# Patient Record
Sex: Male | Born: 1965 | Race: Black or African American | Hispanic: No | Marital: Single | Smoking: Light tobacco smoker
Health system: Southern US, Community
[De-identification: ages and names within clinical notes are randomized; demographics above are authoritative.]

## PROBLEM LIST (undated history)

## (undated) HISTORY — PX: TOE SURGERY: SHX1073

---

## 2001-01-30 ENCOUNTER — Emergency Department (HOSPITAL_COMMUNITY): Admission: EM | Admit: 2001-01-30 | Discharge: 2001-01-30 | Payer: Self-pay | Admitting: *Deleted

## 2004-05-07 ENCOUNTER — Emergency Department (HOSPITAL_COMMUNITY): Admission: EM | Admit: 2004-05-07 | Discharge: 2004-05-07 | Payer: Self-pay | Admitting: Emergency Medicine

## 2004-07-25 ENCOUNTER — Emergency Department (HOSPITAL_COMMUNITY): Admission: EM | Admit: 2004-07-25 | Discharge: 2004-07-25 | Payer: Self-pay | Admitting: Emergency Medicine

## 2004-09-01 ENCOUNTER — Emergency Department (HOSPITAL_COMMUNITY): Admission: EM | Admit: 2004-09-01 | Discharge: 2004-09-01 | Payer: Self-pay | Admitting: Emergency Medicine

## 2004-09-01 ENCOUNTER — Emergency Department (HOSPITAL_COMMUNITY): Admission: EM | Admit: 2004-09-01 | Discharge: 2004-09-01 | Payer: Self-pay | Admitting: Family Medicine

## 2005-07-30 ENCOUNTER — Emergency Department (HOSPITAL_COMMUNITY): Admission: EM | Admit: 2005-07-30 | Discharge: 2005-07-30 | Payer: Self-pay | Admitting: Family Medicine

## 2006-11-23 ENCOUNTER — Emergency Department (HOSPITAL_COMMUNITY): Admission: EM | Admit: 2006-11-23 | Discharge: 2006-11-23 | Payer: Self-pay | Admitting: Emergency Medicine

## 2006-12-24 ENCOUNTER — Emergency Department (HOSPITAL_COMMUNITY): Admission: EM | Admit: 2006-12-24 | Discharge: 2006-12-24 | Payer: Self-pay | Admitting: Emergency Medicine

## 2006-12-25 ENCOUNTER — Emergency Department (HOSPITAL_COMMUNITY): Admission: EM | Admit: 2006-12-25 | Discharge: 2006-12-25 | Payer: Self-pay | Admitting: Emergency Medicine

## 2007-06-16 ENCOUNTER — Emergency Department (HOSPITAL_COMMUNITY): Admission: EM | Admit: 2007-06-16 | Discharge: 2007-06-17 | Payer: Self-pay | Admitting: Emergency Medicine

## 2007-07-22 ENCOUNTER — Emergency Department (HOSPITAL_COMMUNITY): Admission: EM | Admit: 2007-07-22 | Discharge: 2007-07-22 | Payer: Self-pay | Admitting: Emergency Medicine

## 2008-04-02 ENCOUNTER — Emergency Department (HOSPITAL_COMMUNITY): Admission: EM | Admit: 2008-04-02 | Discharge: 2008-04-02 | Payer: Self-pay | Admitting: Emergency Medicine

## 2008-06-22 ENCOUNTER — Emergency Department (HOSPITAL_COMMUNITY): Admission: EM | Admit: 2008-06-22 | Discharge: 2008-06-22 | Payer: Self-pay | Admitting: Emergency Medicine

## 2009-05-24 ENCOUNTER — Emergency Department (HOSPITAL_COMMUNITY): Admission: EM | Admit: 2009-05-24 | Discharge: 2009-05-24 | Payer: Self-pay | Admitting: Family Medicine

## 2011-03-04 ENCOUNTER — Emergency Department (HOSPITAL_COMMUNITY): Payer: Self-pay

## 2011-03-04 ENCOUNTER — Emergency Department (HOSPITAL_COMMUNITY)
Admission: EM | Admit: 2011-03-04 | Discharge: 2011-03-04 | Disposition: A | Discharge status: Home / Self Care | Payer: Self-pay | Attending: Emergency Medicine | Admitting: Emergency Medicine

## 2011-03-04 DIAGNOSIS — J329 Chronic sinusitis, unspecified: Secondary | ICD-10-CM | POA: Insufficient documentation

## 2011-03-04 DIAGNOSIS — IMO0001 Reserved for inherently not codable concepts without codable children: Secondary | ICD-10-CM | POA: Insufficient documentation

## 2011-03-04 DIAGNOSIS — J3489 Other specified disorders of nose and nasal sinuses: Secondary | ICD-10-CM | POA: Insufficient documentation

## 2011-03-04 DIAGNOSIS — R059 Cough, unspecified: Secondary | ICD-10-CM | POA: Insufficient documentation

## 2011-03-04 DIAGNOSIS — R05 Cough: Secondary | ICD-10-CM | POA: Insufficient documentation

## 2011-03-04 DIAGNOSIS — R6883 Chills (without fever): Secondary | ICD-10-CM | POA: Insufficient documentation

## 2011-05-29 ENCOUNTER — Emergency Department (HOSPITAL_COMMUNITY): Payer: Self-pay

## 2011-05-29 ENCOUNTER — Emergency Department (HOSPITAL_COMMUNITY)
Admission: EM | Admit: 2011-05-29 | Discharge: 2011-05-29 | Disposition: A | Discharge status: Home / Self Care | Payer: Self-pay | Attending: Emergency Medicine | Admitting: Emergency Medicine

## 2011-05-29 DIAGNOSIS — M542 Cervicalgia: Secondary | ICD-10-CM | POA: Insufficient documentation

## 2011-05-29 DIAGNOSIS — M25519 Pain in unspecified shoulder: Secondary | ICD-10-CM | POA: Insufficient documentation

## 2011-06-13 LAB — HEPATIC FUNCTION PANEL
ALT: 14
AST: 21
Albumin: 4.1
Alkaline Phosphatase: 60
Bilirubin, Direct: 0.2
Indirect Bilirubin: 0.5
Total Bilirubin: 0.7
Total Protein: 7

## 2011-06-13 LAB — DIFFERENTIAL
Basophils Absolute: 0.1
Basophils Relative: 1
Eosinophils Absolute: 0
Eosinophils Relative: 1
Lymphocytes Relative: 24
Lymphs Abs: 1.7
Monocytes Absolute: 0.8 — ABNORMAL HIGH
Monocytes Relative: 11
Neutro Abs: 4.5
Neutrophils Relative %: 63

## 2011-06-13 LAB — BASIC METABOLIC PANEL
BUN: 12
CO2: 27
Calcium: 9.5
Chloride: 98
Creatinine, Ser: 1.12
GFR calc Af Amer: >60
GFR calc non Af Amer: >60
Glucose, Bld: 88
Potassium: 4
Sodium: 132 — ABNORMAL LOW

## 2011-06-13 LAB — CBC
HCT: 44.8
Hemoglobin: 15.1
MCHC: 33.7
MCV: 86.1
Platelets: 162
RBC: 5.2
RDW: 14.1 — ABNORMAL HIGH
WBC: 7.1

## 2011-06-13 LAB — LIPASE, BLOOD: Lipase: 25

## 2011-07-14 ENCOUNTER — Encounter: Payer: Self-pay | Admitting: *Deleted

## 2011-07-14 ENCOUNTER — Emergency Department (HOSPITAL_COMMUNITY): Payer: Self-pay

## 2011-07-14 ENCOUNTER — Inpatient Hospital Stay (HOSPITAL_COMMUNITY)
Admission: EM | Admit: 2011-07-14 | Discharge: 2011-07-17 | DRG: 563 | Disposition: A | Discharge status: Home / Self Care | Payer: Self-pay | Attending: Orthopaedic Surgery | Admitting: Orthopaedic Surgery

## 2011-07-14 DIAGNOSIS — S82891A Other fracture of right lower leg, initial encounter for closed fracture: Secondary | ICD-10-CM

## 2011-07-14 DIAGNOSIS — S92009A Unspecified fracture of unspecified calcaneus, initial encounter for closed fracture: Secondary | ICD-10-CM | POA: Diagnosis present

## 2011-07-14 DIAGNOSIS — S82892A Other fracture of left lower leg, initial encounter for closed fracture: Secondary | ICD-10-CM

## 2011-07-14 DIAGNOSIS — S92002A Unspecified fracture of left calcaneus, initial encounter for closed fracture: Secondary | ICD-10-CM

## 2011-07-14 DIAGNOSIS — W108XXA Fall (on) (from) other stairs and steps, initial encounter: Secondary | ICD-10-CM | POA: Diagnosis present

## 2011-07-14 DIAGNOSIS — S82871A Displaced pilon fracture of right tibia, initial encounter for closed fracture: Secondary | ICD-10-CM

## 2011-07-14 DIAGNOSIS — S82899A Other fracture of unspecified lower leg, initial encounter for closed fracture: Principal | ICD-10-CM | POA: Diagnosis present

## 2011-07-14 LAB — CBC
HCT: 39.8 % (ref 39.0–52.0)
HCT: 39.1 % (ref 39.0–52.0)
Hemoglobin: 13.3 g/dL (ref 13.0–17.0)
Hemoglobin: 12.8 g/dL — ABNORMAL LOW (ref 13.0–17.0)
MCH: 29.8 pg (ref 26.0–34.0)
MCH: 29.3 pg (ref 26.0–34.0)
MCHC: 33.4 g/dL (ref 30.0–36.0)
MCHC: 32.7 g/dL (ref 30.0–36.0)
MCV: 89.2 fL (ref 78.0–100.0)
MCV: 89.5 fL (ref 78.0–100.0)
Platelets: 179 10*3/uL (ref 150–400)
Platelets: 172 10*3/uL (ref 150–400)
RBC: 4.46 MIL/uL (ref 4.22–5.81)
RBC: 4.37 MIL/uL (ref 4.22–5.81)
RDW: 13.6 % (ref 11.5–15.5)
RDW: 13.3 % (ref 11.5–15.5)
WBC: 10.4 10*3/uL (ref 4.0–10.5)
WBC: 11.6 10*3/uL — ABNORMAL HIGH (ref 4.0–10.5)

## 2011-07-14 LAB — DIFFERENTIAL
Basophils Absolute: 0.1 10*3/uL (ref 0.0–0.1)
Basophils Relative: 1 % (ref 0–1)
Eosinophils Absolute: 0.1 10*3/uL (ref 0.0–0.7)
Eosinophils Relative: 1 % (ref 0–5)
Lymphocytes Relative: 20 % (ref 12–46)
Lymphs Abs: 2.1 10*3/uL (ref 0.7–4.0)
Monocytes Absolute: 0.8 10*3/uL (ref 0.1–1.0)
Monocytes Relative: 7 % (ref 3–12)
Neutro Abs: 7.4 10*3/uL (ref 1.7–7.7)
Neutrophils Relative %: 72 % (ref 43–77)

## 2011-07-14 LAB — BASIC METABOLIC PANEL
BUN: 11 mg/dL (ref 6–23)
CO2: 26 mEq/L (ref 19–32)
Calcium: 9.5 mg/dL (ref 8.4–10.5)
Chloride: 106 mEq/L (ref 96–112)
Creatinine, Ser: 0.88 mg/dL (ref 0.50–1.35)
GFR calc Af Amer: >90 mL/min (ref >90)
GFR calc non Af Amer: >90 mL/min (ref >90)
Glucose, Bld: 110 mg/dL — ABNORMAL HIGH (ref 70–99)
Potassium: 3.9 mEq/L (ref 3.5–5.1)
Sodium: 141 mEq/L (ref 135–145)

## 2011-07-14 LAB — PROTIME-INR
INR: 0.89 (ref 0.00–1.49)
Prothrombin Time: 12.2 seconds (ref 11.6–15.2)

## 2011-07-14 MED ORDER — THERA M PLUS PO TABS
1.0000 | ORAL_TABLET | Freq: Every day | ORAL | Status: DC
  Administered 2011-07-14 – 2011-07-16 (×3): 1 via ORAL
  Filled 2011-07-14 (×4): qty 1

## 2011-07-14 MED ORDER — SENNA 8.6 MG PO TABS
2.0000 | ORAL_TABLET | Freq: Every day | ORAL | Status: DC | PRN
  Filled 2011-07-14: qty 2

## 2011-07-14 MED ORDER — HYDROMORPHONE HCL PF 1 MG/ML IJ SOLN
INTRAMUSCULAR | Status: AC
  Administered 2011-07-14: 1 mg via INTRAMUSCULAR
  Filled 2011-07-14: qty 1

## 2011-07-14 MED ORDER — OXYCODONE HCL 5 MG PO TABS
5.0000 mg | ORAL_TABLET | ORAL | Status: DC | PRN
  Administered 2011-07-16: 5 mg via ORAL
  Filled 2011-07-14 (×2): qty 1

## 2011-07-14 MED ORDER — SODIUM CHLORIDE 0.9 % IV SOLN
250.0000 mL | INTRAVENOUS | Status: DC
  Administered 2011-07-14: 250 mL via INTRAVENOUS
  Administered 2011-07-15: 12:00:00 via INTRAVENOUS
  Administered 2011-07-16: 1000 mL via INTRAVENOUS

## 2011-07-14 MED ORDER — BISACODYL 10 MG RE SUPP: 10.0000 mg | Freq: Every day | RECTAL | Status: DC | PRN

## 2011-07-14 MED ORDER — NALOXONE HCL 0.4 MG/ML IJ SOLN: 0.4000 mg | INTRAMUSCULAR | Status: DC | PRN

## 2011-07-14 MED ORDER — ONDANSETRON 4 MG PO TBDP
ORAL_TABLET | ORAL | Status: AC
  Administered 2011-07-14: 8 mg
  Filled 2011-07-14: qty 2

## 2011-07-14 MED ORDER — HYDROMORPHONE HCL PF 1 MG/ML IJ SOLN
1.0000 mg | Freq: Once | INTRAMUSCULAR | Status: AC
  Administered 2011-07-14: 1 mg via INTRAVENOUS
  Filled 2011-07-14: qty 1

## 2011-07-14 MED ORDER — DIPHENHYDRAMINE HCL 50 MG/ML IJ SOLN: 25.0000 mg | Freq: Four times a day (QID) | INTRAMUSCULAR | Status: DC | PRN

## 2011-07-14 MED ORDER — MORPHINE SULFATE 2 MG/ML IJ SOLN
2.0000 mg | INTRAMUSCULAR | Status: DC | PRN
  Filled 2011-07-14: qty 1

## 2011-07-14 MED ORDER — HYDROMORPHONE HCL PF 1 MG/ML IJ SOLN
1.0000 mg | INTRAMUSCULAR | Status: DC | PRN
  Administered 2011-07-14 – 2011-07-16 (×3): 1 mg via INTRAVENOUS
  Filled 2011-07-14 (×3): qty 1

## 2011-07-14 MED ORDER — HYDROMORPHONE 0.3 MG/ML IV SOLN
INTRAVENOUS | Status: DC
  Administered 2011-07-14: 7.5 mg via INTRAVENOUS
  Administered 2011-07-14: 0.3 mL via INTRAVENOUS
  Administered 2011-07-14: 2.3 mg via INTRAVENOUS
  Administered 2011-07-15: 7.5 mg via INTRAVENOUS
  Administered 2011-07-15: 4.03 mg via INTRAVENOUS
  Administered 2011-07-15: 3.3 mg via INTRAVENOUS
  Administered 2011-07-15: 3.72 mg via INTRAVENOUS
  Administered 2011-07-15: 3 mg via INTRAVENOUS
  Administered 2011-07-15: 7.5 mg via INTRAVENOUS
  Administered 2011-07-15: 2.39 mg via INTRAVENOUS
  Administered 2011-07-16: 0.6 mg via INTRAVENOUS
  Administered 2011-07-16: 1.8 mg via INTRAVENOUS
  Administered 2011-07-16: 2.1 mg via INTRAVENOUS
  Filled 2011-07-14 (×5): qty 25

## 2011-07-14 MED ORDER — WARFARIN VIDEO
Freq: Once | Status: DC
  Filled 2011-07-14: qty 1

## 2011-07-14 MED ORDER — ONDANSETRON HCL 4 MG/2ML IJ SOLN: 4.0000 mg | Freq: Four times a day (QID) | INTRAMUSCULAR | Status: DC | PRN

## 2011-07-14 MED ORDER — ACETAMINOPHEN 325 MG PO TABS: 650.0000 mg | ORAL_TABLET | Freq: Four times a day (QID) | ORAL | Status: DC | PRN

## 2011-07-14 MED ORDER — HYDROMORPHONE HCL PF 1 MG/ML IJ SOLN: 1.0000 mg | Freq: Once | INTRAMUSCULAR | Status: DC

## 2011-07-14 MED ORDER — SODIUM CHLORIDE 0.9 % IV SOLN
Freq: Once | INTRAVENOUS | Status: AC
  Administered 2011-07-14: 07:00:00 via INTRAVENOUS

## 2011-07-14 MED ORDER — SODIUM CHLORIDE 0.9 % IJ SOLN
3.0000 mL | Freq: Two times a day (BID) | INTRAMUSCULAR | Status: DC
  Administered 2011-07-14: 3 mL via INTRAVENOUS

## 2011-07-14 MED ORDER — SODIUM CHLORIDE 0.9 % IJ SOLN: 3.0000 mL | INTRAMUSCULAR | Status: DC | PRN

## 2011-07-14 MED ORDER — ALUM & MAG HYDROXIDE-SIMETH 200-200-20 MG/5ML PO SUSP: 30.0000 mL | Freq: Four times a day (QID) | ORAL | Status: DC | PRN

## 2011-07-14 MED ORDER — METHOCARBAMOL 500 MG PO TABS
500.0000 mg | ORAL_TABLET | Freq: Four times a day (QID) | ORAL | Status: DC | PRN
  Administered 2011-07-14 – 2011-07-17 (×9): 500 mg via ORAL
  Filled 2011-07-14 (×9): qty 1

## 2011-07-14 MED ORDER — SODIUM CHLORIDE 0.9 % IV SOLN: INTRAVENOUS | Status: DC

## 2011-07-14 MED ORDER — DIPHENHYDRAMINE HCL 12.5 MG/5ML PO ELIX
12.5000 mg | ORAL_SOLUTION | Freq: Four times a day (QID) | ORAL | Status: DC | PRN
  Filled 2011-07-14: qty 5

## 2011-07-14 MED ORDER — ENOXAPARIN SODIUM 40 MG/0.4ML ~~LOC~~ SOLN
40.0000 mg | SUBCUTANEOUS | Status: DC
  Administered 2011-07-14 – 2011-07-15 (×2): 40 mg via SUBCUTANEOUS
  Filled 2011-07-14 (×3): qty 0.4

## 2011-07-14 MED ORDER — COUMADIN BOOK
Freq: Once | Status: AC
  Administered 2011-07-14: 18:00:00
  Filled 2011-07-14: qty 1

## 2011-07-14 MED ORDER — WARFARIN SODIUM 10 MG PO TABS
10.0000 mg | ORAL_TABLET | Freq: Once | ORAL | Status: AC
  Administered 2011-07-14: 10 mg via ORAL
  Filled 2011-07-14: qty 1

## 2011-07-14 MED ORDER — HYDROMORPHONE HCL PF 1 MG/ML IJ SOLN
1.0000 mg | Freq: Once | INTRAMUSCULAR | Status: DC
  Administered 2011-07-14: 1 mg via INTRAMUSCULAR

## 2011-07-14 MED ORDER — ACETAMINOPHEN 650 MG RE SUPP: 650.0000 mg | Freq: Four times a day (QID) | RECTAL | Status: DC | PRN

## 2011-07-14 MED ORDER — HYDROMORPHONE HCL PF 1 MG/ML IJ SOLN
INTRAMUSCULAR | Status: AC
  Administered 2011-07-14: 06:00:00 via INTRAMUSCULAR
  Filled 2011-07-14: qty 1

## 2011-07-14 MED ORDER — ONDANSETRON HCL 4 MG/2ML IJ SOLN
4.0000 mg | Freq: Once | INTRAMUSCULAR | Status: AC
  Administered 2011-07-14: 4 mg via INTRAVENOUS
  Filled 2011-07-14: qty 2

## 2011-07-14 NOTE — H&P (Signed)
Kevin Moran is an 45 y.o. male.   Chief Complaint: bilateral foot and ankle pain  HPI: 45 yo male brought to the ER after hurting his ankles and feet when he jumped down 6-7 steps and landed awkwardly.  Reports severe pain in his feet and ankles.  Denies severe numbness.  Denies other injuries.  History reviewed. No pertinent past medical history.  History reviewed. No pertinent past surgical history.  No family history on file. Social History:  reports that he has never smoked. He does not have any smokeless tobacco history on file. He reports that he drinks alcohol. He reports that he does not use illicit drugs.  Allergies: No Known Allergies  Meds: none  Results for orders placed during the hospital encounter of 07/14/11 (from the past 48 hour(s))  CBC     Status: Normal   Collection Time   07/14/11  6:19 AM      Component Value Range Comment   WBC 10.4  4.0 - 10.5 (K/uL)    RBC 4.46  4.22 - 5.81 (MIL/uL)    Hemoglobin 13.3  13.0 - 17.0 (g/dL)    HCT 16.1  09.6 - 04.5 (%)    MCV 89.2  78.0 - 100.0 (fL)    MCH 29.8  26.0 - 34.0 (pg)    MCHC 33.4  30.0 - 36.0 (g/dL)    RDW 40.9  81.1 - 91.4 (%)    Platelets 179  150 - 400 (K/uL)   DIFFERENTIAL     Status: Normal   Collection Time   07/14/11  6:19 AM      Component Value Range Comment   Neutrophils Relative 72  43 - 77 (%)    Neutro Abs 7.4  1.7 - 7.7 (K/uL)    Lymphocytes Relative 20  12 - 46 (%)    Lymphs Abs 2.1  0.7 - 4.0 (K/uL)    Monocytes Relative 7  3 - 12 (%)    Monocytes Absolute 0.8  0.1 - 1.0 (K/uL)    Eosinophils Relative 1  0 - 5 (%)    Eosinophils Absolute 0.1  0.0 - 0.7 (K/uL)    Basophils Relative 1  0 - 1 (%)    Basophils Absolute 0.1  0.0 - 0.1 (K/uL)   BASIC METABOLIC PANEL     Status: Abnormal   Collection Time   07/14/11  6:19 AM      Component Value Range Comment   Sodium 141  135 - 145 (mEq/L)    Potassium 3.9  3.5 - 5.1 (mEq/L)    Chloride 106  96 - 112 (mEq/L)    CO2 26  19 - 32 (mEq/L)      Glucose, Bld 110 (*) 70 - 99 (mg/dL)    BUN 11  6 - 23 (mg/dL)    Creatinine, Ser 7.82  0.50 - 1.35 (mg/dL)    Calcium 9.5  8.4 - 10.5 (mg/dL)    GFR calc non Af Amer >90  >90 (mL/min)    GFR calc Af Amer >90  >90 (mL/min)    Dg Ankle Complete Left  07/14/2011  *RADIOLOGY REPORT*  Clinical Data: Injury to left ankle, with left ankle and foot pain.  LEFT ANKLE COMPLETE - 3+ VIEW  Comparison: None.  Findings: There is a poorly characterized comminuted fracture involving the calcaneus, with extension to the subtalar joint.  No definite additional fractures are seen.  The ankle mortise is intact; the interosseous space is within normal limits.  No talar tilt or subluxation is seen.  Mild lateral soft tissue swelling is noted.  IMPRESSION: Poorly characterized comminuted fracture involving the calcaneus, with extension to the subtalar joint.  Original Report Authenticated By: Tonia Ghent, M.D.   Dg Ankle Complete Right  07/14/2011  *RADIOLOGY REPORT*  Clinical Data: Right ankle injury; right ankle pain.  RIGHT ANKLE - COMPLETE 3+ VIEW  Comparison: None.  Findings: There is a comminuted fracture involving the distal tibia, with fracture lines extending across the medial and posterior malleoli, and also across the midportion of the tibial plafond.  No definite fibular fracture is seen, though follow-up proximal fibular radiograph is suggested for further evaluation.  There is mild step-off of the tibial fractures at the ankle mortise; the talar dome appears grossly intact.  The interosseous space is within normal limits.  No significant talar tilt or subluxation is seen.  No significant soft tissue abnormalities are seen.  IMPRESSION:  1.  Comminuted fracture involving the distal tibia, with fracture lines extending across the medial and posterior malleoli, and also across the midportion of the tibial plafond.  Mild associated step- off at the ankle mortise; the talar dome appears grossly intact. 2.  No  definite fibular fracture is seen; follow-up proximal fibular radiograph is suggested for further evaluation.  Original Report Authenticated By: Tonia Ghent, M.D.   Ct Ankle Right Wo Contrast  07/14/2011  *RADIOLOGY REPORT*  Clinical Data: Comminuted distal tibia fracture.  CT OF THE RIGHT ANKLE WITH CONTRAST  Technique:  Multidetector CT imaging was performed following the standard protocol during bolus administration of intravenous contrast.  Comparison: Radiographs dated 07/14/2011  Findings: There is a severely comminuted fracture of the distal right tibia. The distal tibial articular surface is severely comminuted with a fracture involving most of the articular surface from anterior to posterior.  There is slight displacement of the posterior fragments but the the medial malleolar fragment is only minimally distracted. There is slight impaction of the lateral aspect of the articular surface posteriorly but this is only 2 mm.  There is no disruption of the ankle mortise.  Distal tibia is intact.  Distal syndesmosis appears to be intact.  There is no visible tendon entrapment.  The talus and calcaneus and the other visualized tarsal bones are normal.  IMPRESSION: Severely comminuted fracture of the articular surface of the distal tibia with medial and posterior malleolar fractures as well. Slight impaction of the posterior aspect of the articular surface of the tibia.  Original Report Authenticated By: Gwynn Burly, M.D.   Ct Ankle Left Wo Contrast  07/14/2011  *RADIOLOGY REPORT*  Clinical Data: Calcaneus fracture.  CT OF THE LEFT ANKLE WITHOUT CONTRAST  Technique:  Multidetector CT imaging was performed according to the standard protocol. Multiplanar CT image reconstructions were also generated.  Comparison: Radiographs dated 07/14/2011  Findings: The patient has a severely comminuted fracture of the calcaneus.  The posterior aspect of the posterior facet is severely comminuted. Base of the  sustentaculum tali is fractured but the articular surface of the medial facet is intact.  Anterior facet is comminuted.  There is impaction particularly of the posterior lateral aspect of the calcaneus.  Distal tibia and fibula and talus are normal.  No visible tendon entrapment.  IMPRESSION: Impacted severely comminuted fracture of the calcaneus as described.  Original Report Authenticated By: Gwynn Burly, M.D.    Review of Systems  All other systems reviewed and are negative.      Blood pressure  142/92, pulse 48, temperature 97.6 F (36.4 C), temperature source Oral, resp. rate 28, height 6\' 2"  (1.88 m), weight 73.5 kg (162 lb 0.6 oz), SpO2 100.00%. Physical Exam  Constitutional: He is oriented to person, place, and time. He appears well-developed and well-nourished.  HENT:  Head: Atraumatic.  Eyes: Pupils are equal, round, and reactive to light.  Neck: Normal range of motion. Neck supple.  Cardiovascular: Normal rate.   Respiratory: Effort normal and breath sounds normal.  GI: Soft. Bowel sounds are normal.  Musculoskeletal:       Right ankle: He exhibits decreased range of motion, swelling and ecchymosis. tenderness. Lateral malleolus and medial malleolus tenderness found.       Left foot: He exhibits decreased range of motion, tenderness, bony tenderness and swelling.  Neurological: He is alert and oriented to person, place, and time.  Skin: Skin is warm and dry.   Both feet have good pulses.  Assessment/Plan Bilateral lower extremity injuries with a right pilon fracture and a left calcaneus fracture 1) Admit to ortho Magnus Ivan 2) NWB bil lower extremities 3) bilateral Jones splints 4) elevation, ice, pain management, DVT prophylaxsis 5) PT/OT consults  Marvis Bakken Y 07/14/2011, 1:29 PM

## 2011-07-14 NOTE — ED Notes (Signed)
Patient states he was jumping and thinks he broke his ankles.  He complains of pain in both ankles

## 2011-07-14 NOTE — ED Notes (Signed)
Sandwich with snacks and beverage provided to pt's spouse.  Pt reminded that he is to remain NPO.

## 2011-07-14 NOTE — ED Provider Notes (Signed)
History     CSN: [id number] Arrival date & time: 07/14/2011  3:47 AM   First MD Initiated Contact with Patient 07/14/11 0408      Chief Complaint  Patient presents with  . Ankle Pain    HPI: Patient is a 45 y.o. male presenting with fall. The history is provided by the patient. No language interpreter was used.  Fall The accident occurred 3 to 5 hours ago. The fall occurred while recreating/playing. He fell from a height of 3 to 5 ft. He landed on a hard floor. Pain location: bilateral ankles. The pain is at a severity of 10/10. The pain is severe. There was no entrapment after the fall. There was no alcohol use involved in the accident. The symptoms are aggravated by standing. He has tried nothing for the symptoms.  States was horseplaying w/ grown son and jumped off of an approx 4 to 5 ft landing and came down wrong. Now w/ severe bil ankle pain and swelling.  History reviewed. No pertinent past medical history.  History reviewed. No pertinent past surgical history.  No family history on file.  History  Substance Use Topics  . Smoking status: Never Smoker   . Smokeless tobacco: Not on file  . Alcohol Use: Yes      Review of Systems  Constitutional: Negative.   HENT: Negative.   Eyes: Negative.   Respiratory: Negative.   Cardiovascular: Negative.   Gastrointestinal: Negative.   Genitourinary: Negative.   Musculoskeletal: Negative.   Skin: Negative.   Neurological: Negative.   Hematological: Negative.   Psychiatric/Behavioral: Negative.     Allergies  Review of patient's allergies indicates no known allergies.  Home Medications  No current outpatient prescriptions on file.  BP 133/89  Pulse 92  Resp 32  SpO2 97%  Physical Exam  Constitutional: He is oriented to person, place, and time. He appears well-developed and well-nourished.  HENT:  Head: Normocephalic and atraumatic.  Eyes: Pupils are equal, round, and reactive to light.  Cardiovascular: Normal  rate.   Pulmonary/Chest: Effort normal.  Musculoskeletal:       Right ankle: He exhibits decreased range of motion, swelling and deformity. He exhibits normal pulse. tenderness.       Left ankle: He exhibits decreased range of motion and swelling. He exhibits no deformity and normal pulse. tenderness.  Neurological: He is alert and oriented to person, place, and time.  Skin: Skin is warm and dry.  Psychiatric: He has a normal mood and affect.    ED Course  Procedures  Bil acute ankle fractures. Consulted w/ Dr August Saucer who will be in to see pt. Findings discussed w/ pt and spouse who are agreeable w/.   Labs Reviewed  CBC  DIFFERENTIAL  BASIC METABOLIC PANEL   Dg Ankle Complete Left  07/14/2011  *RADIOLOGY REPORT*  Clinical Data: Injury to left ankle, with left ankle and foot pain.  LEFT ANKLE COMPLETE - 3+ VIEW  Comparison: None.  Findings: There is a poorly characterized comminuted fracture involving the calcaneus, with extension to the subtalar joint.  No definite additional fractures are seen.  The ankle mortise is intact; the interosseous space is within normal limits.  No talar tilt or subluxation is seen.  Mild lateral soft tissue swelling is noted.  IMPRESSION: Poorly characterized comminuted fracture involving the calcaneus, with extension to the subtalar joint.  Original Report Authenticated By: Tonia Ghent, M.D.   Dg Ankle Complete Right  07/14/2011  *RADIOLOGY REPORT*  Clinical Data:  Right ankle injury; right ankle pain.  RIGHT ANKLE - COMPLETE 3+ VIEW  Comparison: None.  Findings: There is a comminuted fracture involving the distal tibia, with fracture lines extending across the medial and posterior malleoli, and also across the midportion of the tibial plafond.  No definite fibular fracture is seen, though follow-up proximal fibular radiograph is suggested for further evaluation.  There is mild step-off of the tibial fractures at the ankle mortise; the talar dome appears grossly  intact.  The interosseous space is within normal limits.  No significant talar tilt or subluxation is seen.  No significant soft tissue abnormalities are seen.  IMPRESSION:  1.  Comminuted fracture involving the distal tibia, with fracture lines extending across the medial and posterior malleoli, and also across the midportion of the tibial plafond.  Mild associated step- off at the ankle mortise; the talar dome appears grossly intact. 2.  No definite fibular fracture is seen; follow-up proximal fibular radiograph is suggested for further evaluation.  Original Report Authenticated By: Tonia Ghent, M.D.     No diagnosis found.    MDM  Bil acute ankle fx's. Spoke w/ Dr August Saucer @ 0600. Will obtain ct scan of both ankles.and prepare for admission/    Leanne Chang, NP 07/14/11 [id number]  Leanne Chang, NP 07/14/11 [phone]

## 2011-07-14 NOTE — ED Notes (Signed)
Report given to Claris Che, RN on 5000.  Pt to go to room 5032.

## 2011-07-14 NOTE — ED Provider Notes (Signed)
45 year old male who had a fall last night while he was running around and jumping off steps. He landed on his bilateral feet felt a significant pain in both of his ankles and feet and collapsed to the ground. This was acute in onset, constant, unable to walk after the accident.  Physical exam: States patient has tenderness and pain and swelling to the bilateral ankles, normal range of motion of the knees hips wrists. No tenderness over spine, well-appearing without head injury. Lungs are clear no respiratory distress. Neurovascularly intact to the toes.  Assessment according to the plain films the patient has bilateral fractures of his ankle and feet, will need CT scan to further elucidate the etiology and location of these fractures and to help surgical planning. Orthopedic surgery has been consulted and Dr. August Saucer will see the patient and admit.  Medical screening examination/treatment/procedure(s) were conducted as a shared visit with non-physician practitioner(s) and myself.  I personally evaluated the patient during the encounter   Vida Roller, MD 07/14/11 0800

## 2011-07-14 NOTE — Progress Notes (Signed)
ANTICOAGULATION CONSULT NOTE - Initial Consult  Pharmacy Consult for coumadin Indication: VTE prophylaxis  No Known Allergies  Patient Measurements: Height: 6\' 2"  (188 cm) Weight: 162 lb 0.6 oz (73.5 kg) IBW/kg (Calculated) : 82.2   Vital Signs: Temp: 97.6 F (36.4 C) (11/11 1100) Temp src: Oral (11/11 1033) BP: 142/92 mmHg (11/11 1100) Pulse Rate: 48  (11/11 1100)  Labs:  [facility] 07/14/11 0619  HGB 13.3  HCT 39.8  PLT 179  APTT --  LABPROT --  INR --  HEPARINUNFRC --  CREATININE 0.88  CKTOTAL --  CKMB --  TROPONINI --   Estimated Creatinine Clearance: 112.5 ml/min (by C-G formula based on Cr of 0.88).  Medical History: History reviewed. No pertinent past medical history.  Medications:  No prescriptions prior to admission    Assessment: 45 yo male s/p nwb fractures in both lower extremities to be started on coumadin. Will be given 10 mg tonight coumadin. Goal of Therapy:  INR 2-3   Plan:  Coumadin 10 mg po x1 tonight Daily pt/inr F/u cbc daily Coumadin book and video Coumadin education On Lovenox vte prophylaxis for the time being  Kevin Moran, Kevin Moran 07/14/2011,2:34 PM

## 2011-07-15 LAB — PROTIME-INR
INR: 0.9 (ref 0.00–1.49)
Prothrombin Time: 12.3 seconds (ref 11.6–15.2)

## 2011-07-15 MED ORDER — WARFARIN SODIUM 10 MG PO TABS
10.0000 mg | ORAL_TABLET | Freq: Once | ORAL | Status: AC
  Administered 2011-07-15: 10 mg via ORAL
  Filled 2011-07-15: qty 1

## 2011-07-15 NOTE — Progress Notes (Signed)
Physical Therapy Evaluation Patient Details Name: Kevin Moran MRN: [id number] DOB: 08/28/1966 Today's Date: 07/15/2011  Problem List:  Patient Active Problem List  Diagnoses  . Left calcaneal fracture  . Closed right pilon fracture    Past Medical History: History reviewed. No pertinent past medical history. Past Surgical History: History reviewed. No pertinent past surgical history.  PT Assessment/Plan/Recommendation PT Assessment Clinical Impression Statement: Patient presents with a medical diagnosis of bilateral ankle fractures along with the impairments/deficits and therapy diagnosis listed below. Patient will benefit from skilled PT services in the acute care setting to maximize functional mobility and safety in order to d/c home with family support.  PT Recommendation/Assessment: Patient will need skilled PT in the acute care venue PT Problem List: Decreased strength;Decreased range of motion;Decreased activity tolerance;Decreased mobility;Decreased knowledge of use of DME;Decreased knowledge of precautions;Pain PT Therapy Diagnosis : Acute pain PT Plan PT Frequency: Min 5X/week PT Treatment/Interventions: DME instruction;Stair training;Functional mobility training;Therapeutic exercise;Patient/family education;Wheelchair mobility training PT Recommendation Follow Up Recommendations: None Equipment Recommended: Wheelchair (measurements);Wheelchair cushion (measurements) PT Goals  Acute Rehab PT Goals PT Goal Formulation: With patient/family Time For Goal Achievement: 7 days Pt will go Supine/Side to Sit: with modified independence PT Goal: Supine/Side to Sit - Progress: Progressing toward goal Pt will Transfer Bed to Chair/Chair to Bed: with supervision PT Transfer Goal: Bed to Chair/Chair to Bed - Progress: Progressing toward goal Pt will Go Up / Down Stairs: 1-2 stairs;with other equipment (comment) (WC. Family member will be able to bring WC up curb) PT Goal: Up/Down  Stairs - Progress: Other (comment) (unassessed today) Pt will Propel Wheelchair: Independently;> 150 feet PT Goal: Propel Wheelchair - Progress: Other (comment) (unassessed today)  PT Evaluation Precautions/Restrictions  Restrictions Weight Bearing Restrictions: Yes RLE Weight Bearing: Non weight bearing LLE Weight Bearing: Non weight bearing Prior Functioning  Home Living Lives With: Spouse Receives Help From: Family Type of Home: House Home Layout: One level Home Access: Stairs to enter Entrance Stairs-Rails: None Entrance Stairs-Number of Steps: 1 (curb) Bathroom Shower/Tub: Engineer, manufacturing systems: Standard Bathroom Accessibility: Yes How Accessible: Accessible via wheelchair Home Adaptive Equipment: Crutches;Shower chair with back Prior Function Level of Independence: Independent with basic ADLs;Independent with gait;Independent with transfers Driving: Yes Vocation: Full time employment Cognition Cognition Arousal/Alertness: Awake/alert Overall Cognitive Status: Appears within functional limits for tasks assessed Sensation/Coordination Sensation Light Touch: Appears Intact Extremity Assessment RLE Assessment RLE Assessment: Exceptions to [facility] RLE AROM (degrees) Overall AROM Right Lower Extremity: Unable to assess;Due to pain;Due to precautions (Hip and knee WFL) RLE Strength RLE Overall Strength: Due to pain;Unable to assess;Due to precautions (Hip and knee WFL) LLE Assessment LLE Assessment: Exceptions to WFL LLE AROM (degrees) Overall AROM Left Lower Extremity: Due to pain;Unable to assess;Due to precautions (Hip and Knee WFL) LLE Strength LLE Overall Strength: Due to pain;Unable to assess;Due to precautions (Hip and Knee WFL) Mobility (including Balance) Bed Mobility Bed Mobility: Yes Supine to Sit: 4: Min assist (assist of bil LEs for pain control) Supine to Sit Details (indicate cue type and reason): VC for sequencing and hand  placement Transfers Anterior-Posterior Transfer: 4: Min assist Anterior-Posterior Transfer Details (indicate cue type and reason): assist for trunk control. VC for hand placement into recliner. Pt with good UE strength for transfer    Exercise    End of Session PT - End of Session Equipment Utilized During Treatment: Gait belt;Right knee immobilizer Activity Tolerance: Treatment limited secondary to medical complications (Comment) Patient left: in chair;with call bell  in reach;with family/visitor present (wife) Nurse Communication: Mobility status for transfers;Weight bearing status General Behavior During Session: [facility] for tasks performed Cognition: [facility] for tasks performed  Milana Kidney 07/15/2011, 9:27 AM

## 2011-07-15 NOTE — Progress Notes (Signed)
Subjective: No acute changes.  Pain to be expected given fractures.  Will need PT for NWB bil LE.   Objective: Vital signs in last 24 hours: Temp:  [97.6 F (36.4 C)-99.2 F (37.3 C)] 99.1 F (37.3 C) (11/12 0630) Pulse Rate:  [48-85] 85  (11/12 0630) Resp:  [5-28] 18  (11/12 0630) BP: (100-142)/(70-92) 132/78 mmHg (11/12 0630) SpO2:  [96 %-100 %] 99 % (11/12 0630) Weight:  [73.5 kg (162 lb 0.6 oz)] 162 lb 0.6 oz (73.5 kg) (11/11 1100)  Intake/Output from previous day: 11/11 0701 - 11/12 0700 In: 1065 [P.O.:240; I.V.:825] Out: -  Intake/Output this shift:     Basename 07/14/11 1504 07/14/11 0619  HGB 12.8* 13.3    Basename 07/14/11 1504 07/14/11 0619  WBC 11.6* 10.4  RBC 4.37 4.46  HCT 39.1 39.8  PLT 172 179    Basename 07/14/11 1504 07/14/11 0619  NA -- 141  K -- 3.9  CL -- 106  CO2 -- 26  BUN -- 11  CREATININE 0.93 0.88  GLUCOSE -- 110*  CALCIUM -- 9.5    Basename 07/15/11 0540 07/14/11 1504  LABPT -- --  INR 0.90 0.89    Neurovascular intact bil LE.  Jones splints intact  Assessment/Plan: 1) will need PT consult for NWB bil le 2) DVT proph. 3) will place in casts likely tomorrow.   Raziel Koenigs Y 07/15/2011, 7:01 AM

## 2011-07-15 NOTE — Progress Notes (Signed)
ANTICOAGULATION CONSULT NOTE - Follow Up Consult  Pharmacy Consult for coumadin Indication: VTE prophylaxis  No Known Allergies  Patient Measurements: Height: 6\' 2"  (188 cm) Weight: 162 lb 0.6 oz (73.5 kg) IBW/kg (Calculated) : 82.2    Vital Signs: Temp: 99.1 F (37.3 C) (11/12 0630) Temp src: Oral (11/12 0630) BP: 132/78 mmHg (11/12 0630) Pulse Rate: 85  (11/12 0630)  Labs:  Basename 07/15/11 0540 07/14/11 1504 07/14/11 0619  HGB -- 12.8* 13.3  HCT -- 39.1 39.8  PLT -- 172 179  APTT -- -- --  LABPROT 12.3 12.2 --  INR 0.90 0.89 --  HEPARINUNFRC -- -- --  CREATININE -- 0.93 0.88  CKTOTAL -- -- --  CKMB -- -- --  TROPONINI -- -- --   Estimated Creatinine Clearance: 106.5 ml/min (by C-G formula based on Cr of 0.93).   Medications:  On enoxaparin 40mg  daily.  Assessment: Patient on enoxaparin and warfarin for vte prophylaxis.  WBC a little high and hgb is a little low.  Goal of Therapy:  INR 2-3   Plan:  Lovenox to be continued until INR therapeutic. Warfarin 10mg  today at 1800. Check INR in am to determine tomorrow's dose.  Walden Field B 07/15/2011,1:56 PM

## 2011-07-15 NOTE — Progress Notes (Signed)
Occupational Therapy Evaluation Patient Details Name: Kevin Moran MRN: [id number] DOB: 1966-03-15 Today's Date: 07/15/2011  Problem List:  Patient Active Problem List  Diagnoses  . Left calcaneal fracture  . Closed right pilon fracture    Past Medical History: History reviewed. No pertinent past medical history. Past Surgical History: History reviewed. No pertinent past surgical history.  OT Assessment/Plan/Recommendation OT Assessment Clinical Impression Statement: Pt. will benefit from acute OT services to increase pt Independence with BADLS while maintaining Bil LE NWB status OT Recommendation/Assessment: Patient will need skilled OT in the acute care venue OT Problem List: Decreased knowledge of use of DME or AE;Pain Barriers to Discharge: None OT Therapy Diagnosis : Acute pain OT Plan OT Frequency: Min 2X/week OT Treatment/Interventions: Self-care/ADL training;DME and/or AE instruction;Therapeutic activities;Patient/family education OT Recommendation Follow Up Recommendations: Home health OT Equipment Recommended: 3 in 1 bedside comode;Tub/shower bench Individuals Consulted Consulted and Agree with Results and Recommendations: Patient;Family member/caregiver OT Goals Acute Rehab OT Goals OT Goal Formulation: With patient Time For Goal Achievement: 2 weeks ADL Goals Pt Will Perform Grooming: with modified independence;Sitting at sink Pt Will Perform Upper Body Bathing: with modified independence;Sitting, edge of bed;Unsupported Pt Will Perform Lower Body Bathing: with modified independence;Sitting at sink;Unsupported;with adaptive equipment Pt Will Perform Upper Body Dressing: with modified independence;Sitting, chair Pt Will Perform Lower Body Dressing: with modified independence;Unsupported;with adaptive equipment;Sitting, chair Pt Will Transfer to Toilet: with modified independence;3-in-1;Maintaining weight bearing status Pt Will Perform Toileting - Clothing  Manipulation: with modified independence;Sitting on 3-in-1 or toilet Pt Will Perform Toileting - Hygiene: with modified independence;Sitting on 3-in-1 or toilet Pt Will Perform Tub/Shower Transfer: with modified independence;Maintaining weight bearing status;Transfer tub bench  OT Evaluation Precautions/Restrictions  Restrictions Weight Bearing Restrictions: Yes RLE Weight Bearing: Non weight bearing LLE Weight Bearing: Non weight bearing Prior Functioning Home Living Lives With: Spouse Receives Help From: Family Type of Home: House Home Layout: One level Home Access: Stairs to enter Entrance Stairs-Rails: None Entrance Stairs-Number of Steps: 1 Bathroom Shower/Tub: Engineer, manufacturing systems: Standard Bathroom Accessibility: Yes How Accessible: Accessible via wheelchair Home Adaptive Equipment: Crutches;Shower chair with back Prior Function Level of Independence: Independent with basic ADLs;Independent with gait;Independent with transfers Driving: Yes Vocation: Full time employment ADL ADL Eating/Feeding: Performed;Set up Where Assessed - Eating/Feeding: Wheelchair Grooming: Simulated;Set up Where Assessed - Grooming: Sitting, chair Upper Body Bathing: Simulated;Set up Where Assessed - Upper Body Bathing: Sitting, chair Lower Body Bathing: Simulated;Maximal assistance;Other (comment) (due to Bil LE NWB status) Where Assessed - Lower Body Bathing: Sitting, chair Upper Body Dressing: Simulated;Set up Where Assessed - Upper Body Dressing: Sitting, chair Lower Body Dressing: Simulated;Maximal assistance Where Assessed - Lower Body Dressing: Sitting, chair Toilet Transfer: Simulated;Minimal assistance;Other (comment) (Supporting legs with scooting from bed to w/c) Toilet Transfer Method: Other (comment) (Scoot left to right off bed into w/c) Toileting - Clothing Manipulation: Simulated;Maximal assistance Where Assessed - Toileting Clothing Manipulation: Sit on 3-in-1 or  toilet Toileting - Hygiene: Simulated;Maximal assistance Where Assessed - Toileting Hygiene: Sit on 3-in-1 or toilet Tub/Shower Transfer: Not assessed Tub/Shower Transfer Method: Not assessed Equipment Used: Wheelchair Vision/Perception  Vision - History Baseline Vision: No visual deficits Patient Visual Report: No change from baseline Vision - Assessment Eye Alignment: Within Functional Limits Vision Assessment: Vision not tested [employer] Arousal/Alertness: Awake/alert Overall Cognitive Status: Appears within functional limits for tasks assessed Sensation/Coordination Sensation Light Touch: Appears Intact Coordination Gross Motor Movements are Fluid and Coordinated: Yes Fine Motor Movements are Fluid and Coordinated: Yes  Extremity Assessment RUE Assessment RUE Assessment: Within Functional Limits LUE Assessment LUE Assessment: Within Functional Limits Mobility  Bed Mobility Bed Mobility: Yes (scooting off bed into w/c) Transfers Transfers: Yes (bed to w/c) Exercises   End of Session OT - End of Session Activity Tolerance: Patient tolerated treatment well Patient left: Other (comment);with family/visitor present;with call bell in reach (in w/c with brake locked) General Behavior During Session: [facility] for tasks performed Cognition: [facility] for tasks performed   Otis Peak OTS 07/15/2011, 2:32 PM    07/15/2011 Lucile Shutters   OTR/L Pager: [phone] Office: [phone] .

## 2011-07-16 LAB — PROTIME-INR
INR: 1.3 (ref 0.00–1.49)
Prothrombin Time: 16.4 seconds — ABNORMAL HIGH (ref 11.6–15.2)

## 2011-07-16 MED ORDER — OXYCODONE HCL 5 MG PO TABS
10.0000 mg | ORAL_TABLET | ORAL | Status: DC | PRN
  Administered 2011-07-16 – 2011-07-17 (×6): 10 mg via ORAL
  Filled 2011-07-16 (×6): qty 2

## 2011-07-16 MED ORDER — WARFARIN SODIUM 10 MG PO TABS
10.0000 mg | ORAL_TABLET | Freq: Once | ORAL | Status: AC
  Administered 2011-07-16: 10 mg via ORAL
  Filled 2011-07-16: qty 1

## 2011-07-16 MED ORDER — SODIUM CHLORIDE 0.9 % IV SOLN
25.0000 mg | Freq: Three times a day (TID) | INTRAVENOUS | Status: DC | PRN
  Administered 2011-07-16 (×2): 25 mg via INTRAVENOUS
  Filled 2011-07-16 (×4): qty 1

## 2011-07-16 NOTE — Progress Notes (Signed)
Physical Therapy Treatment Patient Details Name: Kevin Moran MRN: [id number] DOB: 1966-04-11 Today's Date: 07/16/2011  PT Assessment/Plan  PT - Assessment/Plan Comments on Treatment Session: Pt has good UE strength and is able to complete all mobility from bed/chair/wheelchair with supervision only. Will attempt family education tomorrow on assist as well as wheelchair mobility/parts management.  PT Plan: Discharge plan remains appropriate PT Frequency: Min 5X/week Follow Up Recommendations: None Equipment Recommended: 3 in 1 bedside comode;Tub/shower bench PT Goals  Acute Rehab PT Goals PT Goal Formulation: With patient/family Time For Goal Achievement: 7 days PT Goal: Supine/Side to Sit - Progress: Progressing toward goal PT Transfer Goal: Bed to Chair/Chair to Bed - Progress: Met PT Goal: Up/Down Stairs - Progress: Other (comment) (unassessed today, will attempt tomorrow with family) PT Goal: Propel Wheelchair - Progress: Progressing toward goal  PT Treatment Precautions/Restrictions  Restrictions Weight Bearing Restrictions: Yes RLE Weight Bearing: Non weight bearing LLE Weight Bearing: Non weight bearing Mobility (including Balance) Bed Mobility Bed Mobility: Yes Supine to Sit: Other (comment) (Setup of wheelchair. No assist given during mobility) Supine to Sit Details (indicate cue type and reason): VC for sequencing Sit to Supine - Left: HOB flat;4: Min assist Sit to Supine - Left Details (indicate cue type and reason): VC for sequencing. Assist with LEs 25% of the time.  Transfers Anterior-Posterior Transfer: 5: Supervision Anterior-Posterior Transfer Details (indicate cue type and reason): VC for sequencing both in/out of bed.  Corporate treasurer: Yes Wheelchair Assistance: 5: Financial planner Details (indicate cue type and reason): VC for sequencing on turns and for backward Tourist information centre manager: Both upper  extremities Wheelchair Parts Management: Needs assistance (Needed assistance for elevating leg rests due to positioning) Distance: 300    Exercise    End of Session PT - End of Session Activity Tolerance: Patient tolerated treatment well;Treatment limited secondary to medical complications (Comment) (NWB bilaterally) Patient left: in bed;with call bell in reach Nurse Communication: Other (comment) (WC mobility) General Behavior During Session: [facility] for tasks performed Cognition: [facility] for tasks performed  Milana Kidney 07/16/2011, 12:03 PM

## 2011-07-16 NOTE — Progress Notes (Signed)
Wheelchair with elevating leg rests and 3 in1 have been ordered for patient for home use.Vance Peper, RN BSN Case Manager

## 2011-07-16 NOTE — Progress Notes (Signed)
ANTICOAGULATION CONSULT NOTE - Follow Up Consult  Pharmacy Consult for coumadin Indication: VTE prophylaxis  No Known Allergies  Patient Measurements: Height: 6\' 2"  (188 cm) Weight: 162 lb 0.6 oz (73.5 kg) IBW/kg (Calculated) : 82.2    Vital Signs: Temp: 98.7 F (37.1 C) (11/13 1400) Temp src: Oral (11/13 1400) BP: 128/80 mmHg (11/13 1400) Pulse Rate: 101  (11/13 1400)  Labs:  Basename 07/16/11 0700 07/15/11 0540 07/14/11 1504 07/14/11 0619  HGB -- -- 12.8* 13.3  HCT -- -- 39.1 39.8  PLT -- -- 172 179  APTT -- -- -- --  LABPROT 16.4* 12.3 12.2 --  INR 1.30 0.90 0.89 --  HEPARINUNFRC -- -- -- --  CREATININE -- -- 0.93 0.88  CKTOTAL -- -- -- --  CKMB -- -- -- --  TROPONINI -- -- -- --   Estimated Creatinine Clearance: 106.5 ml/min (by C-G formula based on Cr of 0.93).   Medications:  On enoxaparin 40mg  daily.  Assessment: Patient on enoxaparin and warfarin for vte prophylaxis s/p bilateral foot fractures.  INR trending up after 2 x 10mg  doses.  Goal of Therapy:  INR 2-3   Plan:  Lovenox to be continued until INR therapeutic. Repeat Warfarin 10mg  today at 1800. Daily INR.  Geraldy Akridge, Judie Bonus 07/16/2011,2:19 PM

## 2011-07-16 NOTE — Progress Notes (Signed)
Subjective: No acute changes.  Working with PT/OT.  Discharge pending therapy clearance.  Objective: Vital signs in last 24 hours: Temp:  [98.7 F (37.1 C)-99 F (37.2 C)] 98.7 F (37.1 C) (11/12 2200) Pulse Rate:  [85-88] 88  (11/12 2200) Resp:  [18-20] 20  (11/13 0400) BP: (141-146)/(84-86) 146/86 mmHg (11/12 2200) SpO2:  [97 %-99 %] 98 % (11/13 0400)  Intake/Output from previous day: 11/12 0701 - 11/13 0700 In: 480 [P.O.:480] Out: 650 [Urine:650] Intake/Output this shift:     Basename 07/14/11 1504 07/14/11 0619  HGB 12.8* 13.3    Basename 07/14/11 1504 07/14/11 0619  WBC 11.6* 10.4  RBC 4.37 4.46  HCT 39.1 39.8  PLT 172 179    Basename 07/14/11 1504 07/14/11 0619  NA -- 141  K -- 3.9  CL -- 106  CO2 -- 26  BUN -- 11  CREATININE 0.93 0.88  GLUCOSE -- 110*  CALCIUM -- 9.5    Basename 07/15/11 0540 07/14/11 1504  LABPT -- --  INR 0.90 0.89    Exam:  bil le with intact jones dressings, nvi  Assessment/Plan: bil short leg casts today Pain control d/c when clears PT  BLACKMAN,CHRISTOPHER Y 07/16/2011, 7:16 AM

## 2011-07-16 NOTE — Plan of Care (Signed)
Problem: Phase III Progression Outcomes Goal: Activity at appropriate level-compared to baseline (UP IN CHAIR FOR HEMODIALYSIS)  Outcome: Progressing Pt at baseline measures for transfers, unable to ambulate with NWB status

## 2011-07-17 LAB — PROTIME-INR
INR: 1.45 (ref 0.00–1.49)
Prothrombin Time: 17.9 seconds — ABNORMAL HIGH (ref 11.6–15.2)

## 2011-07-17 MED ORDER — OXYCODONE-ACETAMINOPHEN 5-325 MG PO TABS: 1.0000 | ORAL_TABLET | ORAL | Status: AC | PRN

## 2011-07-17 MED ORDER — ASPIRIN EC 325 MG PO TBEC: 325.0000 mg | DELAYED_RELEASE_TABLET | Freq: Every day | ORAL | Status: AC

## 2011-07-17 NOTE — Discharge Summary (Signed)
Physician Discharge Summary  Patient ID: Kevin Moran MRN: [id number] DOB/AGE: 03/04/1966 45 y.o.  Admit date: 07/14/2011 Discharge date:   Admission Diagnoses:  Active Problems:  Left calcaneal fracture  Closed right pilon fracture   Discharge Diagnoses:  Same  History reviewed. No pertinent past medical history.  Surgeries:  on * No surgery found *   Discharged Condition: Improved  Hospital Course: Kevin Moran is an 45 y.o. male who was admitted 07/14/2011 with a chief complaint of  Chief Complaint  Patient presents with  . Ankle Pain  , and found to have a diagnosis of right pilon fracture and left calcaneus fracture.  He was admitted for pain control, splinting and therapy since he needs to be non-weight bearing bilaterally. They benefited maximally from their hospital stay and there were no complications.    Recent vital signs:  Filed Vitals:   07/17/11 0652  BP: 126/86  Pulse: 111  Temp: 98.4 F (36.9 C)  Resp: 18    Recent laboratory studies:  Results for orders placed during the hospital encounter of 07/14/11  CBC      Component Value Range   WBC 10.4  4.0 - 10.5 (K/uL)   RBC 4.46  4.22 - 5.81 (MIL/uL)   Hemoglobin 13.3  13.0 - 17.0 (g/dL)   HCT 84.6  96.2 - 95.2 (%)   MCV 89.2  78.0 - 100.0 (fL)   MCH 29.8  26.0 - 34.0 (pg)   MCHC 33.4  30.0 - 36.0 (g/dL)   RDW 84.1  32.4 - 40.1 (%)   Platelets 179  150 - 400 (K/uL)  DIFFERENTIAL      Component Value Range   Neutrophils Relative 72  43 - 77 (%)   Neutro Abs 7.4  1.7 - 7.7 (K/uL)   Lymphocytes Relative 20  12 - 46 (%)   Lymphs Abs 2.1  0.7 - 4.0 (K/uL)   Monocytes Relative 7  3 - 12 (%)   Monocytes Absolute 0.8  0.1 - 1.0 (K/uL)   Eosinophils Relative 1  0 - 5 (%)   Eosinophils Absolute 0.1  0.0 - 0.7 (K/uL)   Basophils Relative 1  0 - 1 (%)   Basophils Absolute 0.1  0.0 - 0.1 (K/uL)  BASIC METABOLIC PANEL      Component Value Range   Sodium 141  135 - 145 (mEq/L)   Potassium 3.9  3.5 -  5.1 (mEq/L)   Chloride 106  96 - 112 (mEq/L)   CO2 26  19 - 32 (mEq/L)   Glucose, Bld 110 (*) 70 - 99 (mg/dL)   BUN 11  6 - 23 (mg/dL)   Creatinine, Ser 0.27  0.50 - 1.35 (mg/dL)   Calcium 9.5  8.4 - 25.3 (mg/dL)   GFR calc non Af Amer >90  >90 (mL/min)   GFR calc Af Amer >90  >90 (mL/min)  CBC      Component Value Range   WBC 11.6 (*) 4.0 - 10.5 (K/uL)   RBC 4.37  4.22 - 5.81 (MIL/uL)   Hemoglobin 12.8 (*) 13.0 - 17.0 (g/dL)   HCT 66.4  40.3 - 47.4 (%)   MCV 89.5  78.0 - 100.0 (fL)   MCH 29.3  26.0 - 34.0 (pg)   MCHC 32.7  30.0 - 36.0 (g/dL)   RDW 25.9  56.3 - 87.5 (%)   Platelets 172  150 - 400 (K/uL)  CREATININE, SERUM      Component Value Range  Creatinine, Ser 0.93  0.50 - 1.35 (mg/dL)   GFR calc non Af Amer >90  >90 (mL/min)   GFR calc Af Amer >90  >90 (mL/min)  PROTIME-INR      Component Value Range   Prothrombin Time 12.2  11.6 - 15.2 (seconds)   INR 0.89  0.00 - 1.49   PROTIME-INR      Component Value Range   Prothrombin Time 12.3  11.6 - 15.2 (seconds)   INR 0.90  0.00 - 1.49   PROTIME-INR      Component Value Range   Prothrombin Time 16.4 (*) 11.6 - 15.2 (seconds)   INR 1.30  0.00 - 1.49   PROTIME-INR      Component Value Range   Prothrombin Time 17.9 (*) 11.6 - 15.2 (seconds)   INR 1.45  0.00 - 1.49     Discharge Medications:  percocet   Diagnostic Studies: Dg Ankle Complete Left  07-22-11  *RADIOLOGY REPORT*  Clinical Data: Injury to left ankle, with left ankle and foot pain.  LEFT ANKLE COMPLETE - 3+ VIEW  Comparison: None.  Findings: There is a poorly characterized comminuted fracture involving the calcaneus, with extension to the subtalar joint.  No definite additional fractures are seen.  The ankle mortise is intact; the interosseous space is within normal limits.  No talar tilt or subluxation is seen.  Mild lateral soft tissue swelling is noted.  IMPRESSION: Poorly characterized comminuted fracture involving the calcaneus, with extension to the  subtalar joint.  Original Report Authenticated By: Tonia Ghent, M.D.   Dg Ankle Complete Right  July 22, 2011  *RADIOLOGY REPORT*  Clinical Data: Right ankle injury; right ankle pain.  RIGHT ANKLE - COMPLETE 3+ VIEW  Comparison: None.  Findings: There is a comminuted fracture involving the distal tibia, with fracture lines extending across the medial and posterior malleoli, and also across the midportion of the tibial plafond.  No definite fibular fracture is seen, though follow-up proximal fibular radiograph is suggested for further evaluation.  There is mild step-off of the tibial fractures at the ankle mortise; the talar dome appears grossly intact.  The interosseous space is within normal limits.  No significant talar tilt or subluxation is seen.  No significant soft tissue abnormalities are seen.  IMPRESSION:  1.  Comminuted fracture involving the distal tibia, with fracture lines extending across the medial and posterior malleoli, and also across the midportion of the tibial plafond.  Mild associated step- off at the ankle mortise; the talar dome appears grossly intact. 2.  No definite fibular fracture is seen; follow-up proximal fibular radiograph is suggested for further evaluation.  Original Report Authenticated By: Tonia Ghent, M.D.   Ct Ankle Right Wo Contrast  07/22/11  *RADIOLOGY REPORT*  Clinical Data: Comminuted distal tibia fracture.  CT OF THE RIGHT ANKLE WITH CONTRAST  Technique:  Multidetector CT imaging was performed following the standard protocol during bolus administration of intravenous contrast.  Comparison: Radiographs dated July 22, 2011  Findings: There is a severely comminuted fracture of the distal right tibia. The distal tibial articular surface is severely comminuted with a fracture involving most of the articular surface from anterior to posterior.  There is slight displacement of the posterior fragments but the the medial malleolar fragment is only minimally distracted.  There is slight impaction of the lateral aspect of the articular surface posteriorly but this is only 2 mm.  There is no disruption of the ankle mortise.  Distal tibia is intact.  Distal syndesmosis appears to be  intact.  There is no visible tendon entrapment.  The talus and calcaneus and the other visualized tarsal bones are normal.  IMPRESSION: Severely comminuted fracture of the articular surface of the distal tibia with medial and posterior malleolar fractures as well. Slight impaction of the posterior aspect of the articular surface of the tibia.  Original Report Authenticated By: Gwynn Burly, M.D.   Ct Ankle Left Wo Contrast  07/14/2011  *RADIOLOGY REPORT*  Clinical Data: Calcaneus fracture.  CT OF THE LEFT ANKLE WITHOUT CONTRAST  Technique:  Multidetector CT imaging was performed according to the standard protocol. Multiplanar CT image reconstructions were also generated.  Comparison: Radiographs dated 07/14/2011  Findings: The patient has a severely comminuted fracture of the calcaneus.  The posterior aspect of the posterior facet is severely comminuted. Base of the sustentaculum tali is fractured but the articular surface of the medial facet is intact.  Anterior facet is comminuted.  There is impaction particularly of the posterior lateral aspect of the calcaneus.  Distal tibia and fibula and talus are normal.  No visible tendon entrapment.  IMPRESSION: Impacted severely comminuted fracture of the calcaneus as described.  Original Report Authenticated By: Gwynn Burly, M.D.    Disposition: Home or Self Care       Signed: Kathryne Hitch 07/17/2011, 7:06 AM

## 2011-07-17 NOTE — Progress Notes (Signed)
Pt declined therapy today as he is discharging shortly and does not have any questions. Pt is at a mod I level for all mobility and independent with WC manipulation and parts management.  07/17/2011 Milana Kidney DPT PAGER: [phone] OFFICE: [phone]

## 2012-02-14 IMAGING — CT CT ANKLE*R* W/O CM
1 series · 12 of 14 positions shown, 15 images · IV contrast (agent unspecified)
Comparison: Radiographs dated 07/14/2011

CLINICAL DATA: Comminuted distal tibia fracture.

CT OF THE RIGHT ANKLE WITH CONTRAST
TECHNIQUE: Multidetector CT imaging was performed following the
standard protocol during bolus administration of intravenous
contrast.

[Series 5: lowextremity 2.0 b20s · axial · 0.27mm/px · z∈[-186,-48]mm · 12 of 83 slices shown, 15 images]
[im 7/83  soft-tissue]
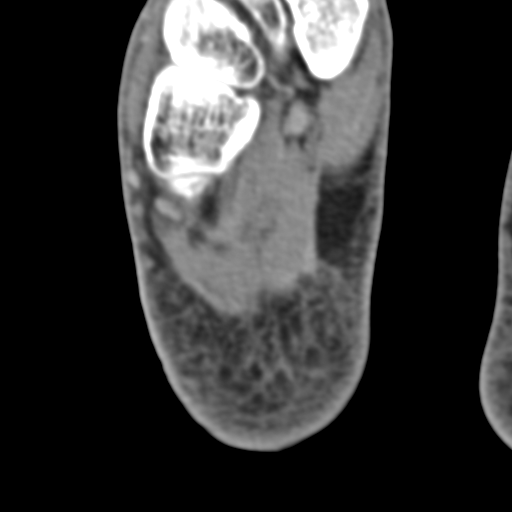
[im 7/83  bone]
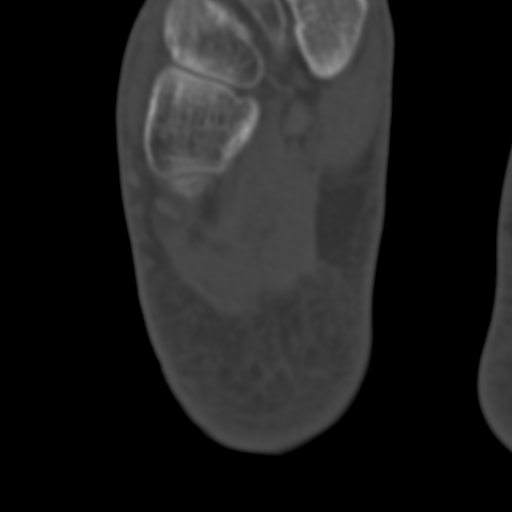
[im 13/83  bone]
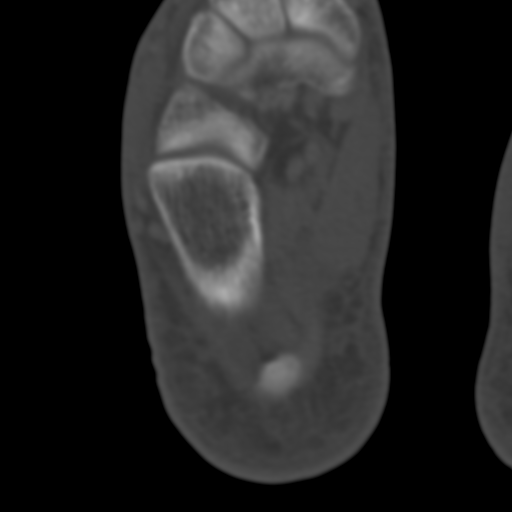
[im 19/83  bone]
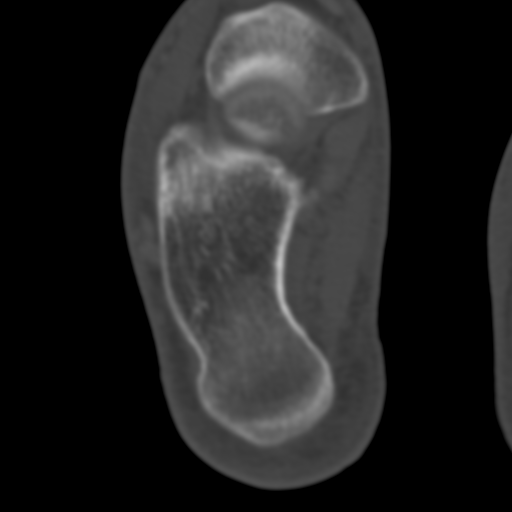
[im 26/83  bone]
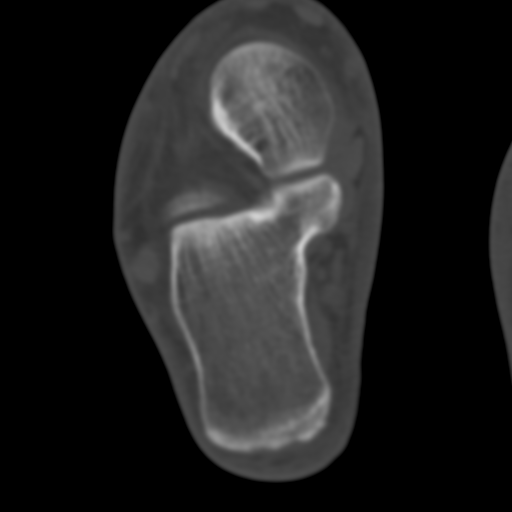
[im 32/83  soft-tissue]
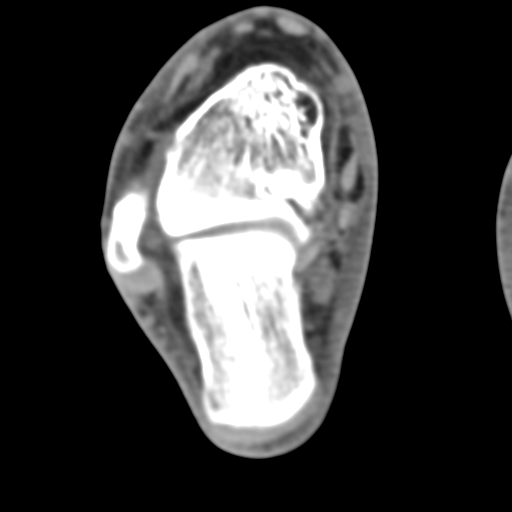
[im 32/83  bone]
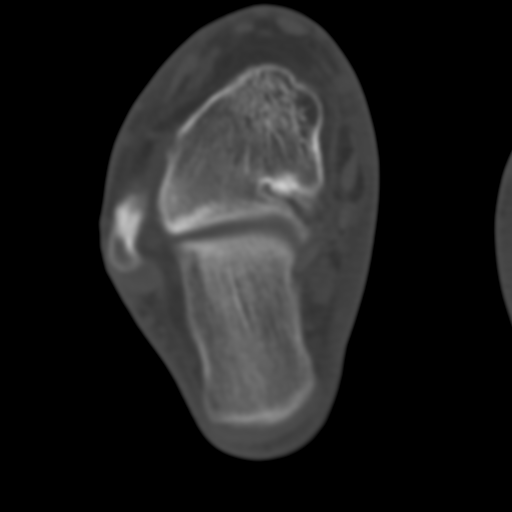
[im 38/83  bone]
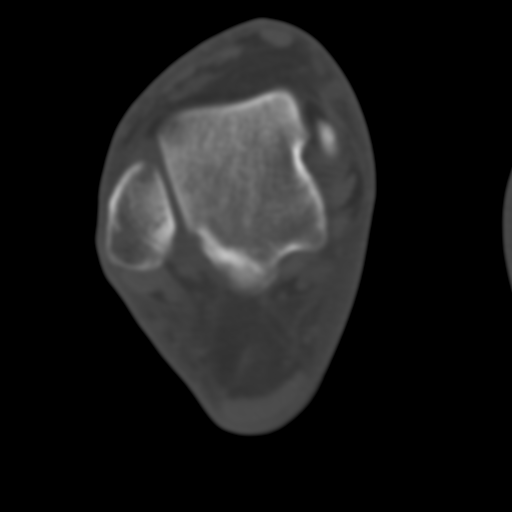
[im 45/83  bone]
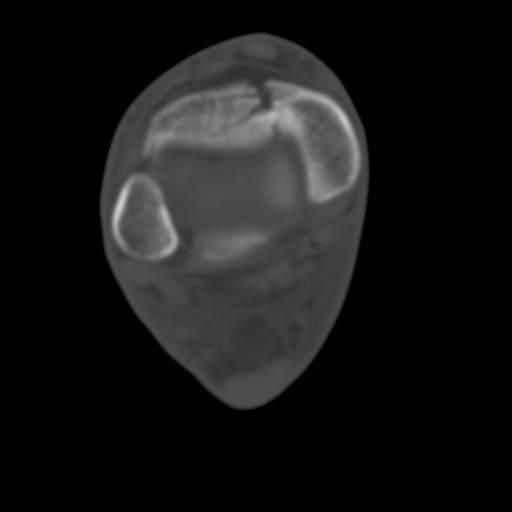
[im 51/83  bone]
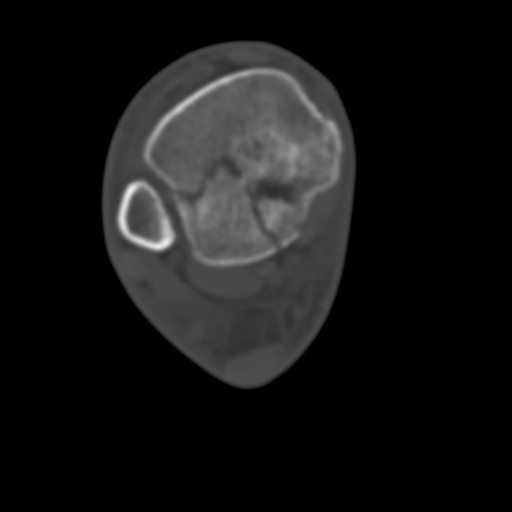
[im 57/83  soft-tissue]
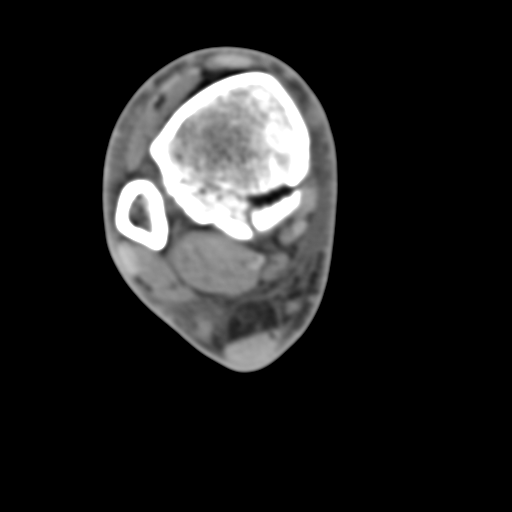
[im 57/83  bone]
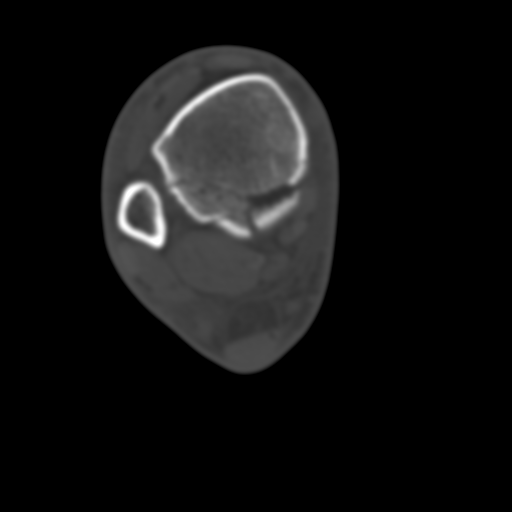
[im 64/83  bone]
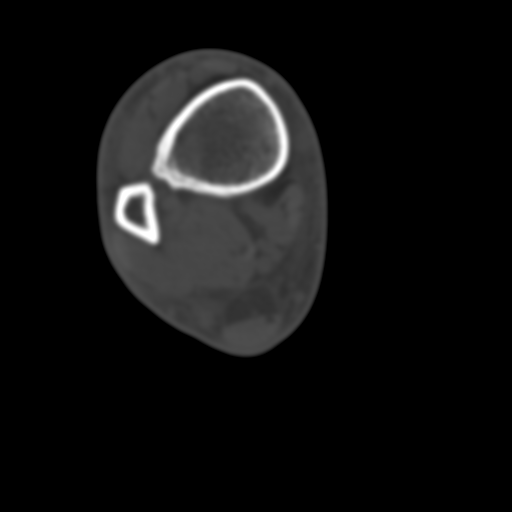
[im 70/83  bone]
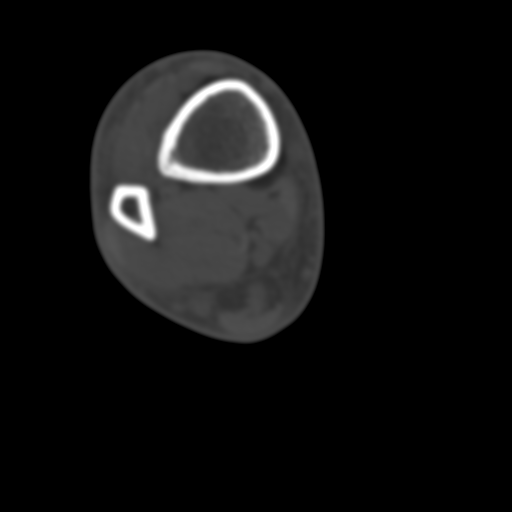
[im 76/83  bone]
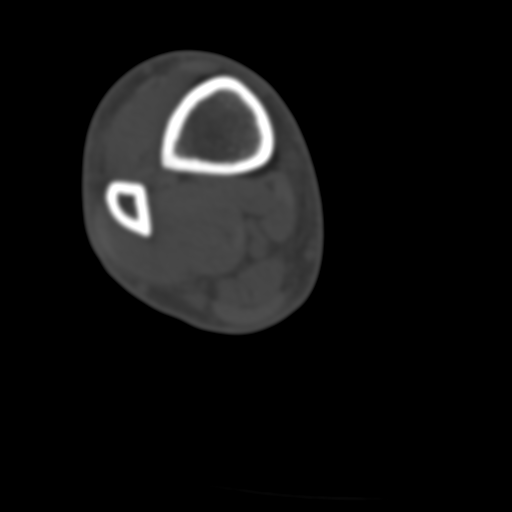

[12 of 14 positions shown; findings below may reference images not displayed]

FINDINGS: There is a severely comminuted fracture of the distal
right tibia. The distal tibial articular surface is severely
comminuted with a fracture involving most of the articular surface
from anterior to posterior.  There is slight displacement of the
posterior fragments but the the medial malleolar fragment is only
minimally distracted. There is slight impaction of the lateral
aspect of the articular surface posteriorly but this is only 2 mm.

There is no disruption of the ankle mortise.  Distal tibia is
intact.  Distal syndesmosis appears to be intact.

There is no visible tendon entrapment.

The talus and calcaneus and the other visualized tarsal bones are
normal.
IMPRESSION: Severely comminuted fracture of the articular surface of the distal
tibia with medial and posterior malleolar fractures as well.
Slight impaction of the posterior aspect of the articular surface
of the tibia.

## 2016-10-08 ENCOUNTER — Encounter (HOSPITAL_COMMUNITY): Payer: Self-pay | Admitting: Emergency Medicine

## 2016-10-08 ENCOUNTER — Emergency Department (HOSPITAL_COMMUNITY)
Admission: EM | Admit: 2016-10-08 | Discharge: 2016-10-08 | Disposition: A | Discharge status: Home / Self Care | Payer: Self-pay | Attending: Emergency Medicine | Admitting: Emergency Medicine

## 2016-10-08 DIAGNOSIS — R112 Nausea with vomiting, unspecified: Secondary | ICD-10-CM | POA: Insufficient documentation

## 2016-10-08 DIAGNOSIS — F17210 Nicotine dependence, cigarettes, uncomplicated: Secondary | ICD-10-CM | POA: Insufficient documentation

## 2016-10-08 DIAGNOSIS — R197 Diarrhea, unspecified: Secondary | ICD-10-CM | POA: Insufficient documentation

## 2016-10-08 DIAGNOSIS — L720 Epidermal cyst: Secondary | ICD-10-CM | POA: Insufficient documentation

## 2016-10-08 LAB — URINALYSIS, ROUTINE W REFLEX MICROSCOPIC
Bilirubin Urine: NEGATIVE
Glucose, UA: NEGATIVE mg/dL
Hgb urine dipstick: NEGATIVE
Ketones, ur: NEGATIVE mg/dL
Leukocytes, UA: NEGATIVE
Nitrite: NEGATIVE
Protein, ur: NEGATIVE mg/dL
Specific Gravity, Urine: 1.005 (ref 1.005–1.030)
pH: 7 (ref 5.0–8.0)

## 2016-10-08 LAB — CBC
HCT: 43.7 % (ref 39.0–52.0)
Hemoglobin: 14.3 g/dL (ref 13.0–17.0)
MCH: 29.4 pg (ref 26.0–34.0)
MCHC: 32.7 g/dL (ref 30.0–36.0)
MCV: 89.9 fL (ref 78.0–100.0)
Platelets: 229 10*3/uL (ref 150–400)
RBC: 4.86 MIL/uL (ref 4.22–5.81)
RDW: 13.9 % (ref 11.5–15.5)
WBC: 7.3 10*3/uL (ref 4.0–10.5)

## 2016-10-08 LAB — COMPREHENSIVE METABOLIC PANEL
ALT: 16 U/L — ABNORMAL LOW (ref 17–63)
AST: 20 U/L (ref 15–41)
Albumin: 4.1 g/dL (ref 3.5–5.0)
Alkaline Phosphatase: 45 U/L (ref 38–126)
Anion gap: 4 — ABNORMAL LOW (ref 5–15)
BUN: 14 mg/dL (ref 6–20)
CO2: 29 mmol/L (ref 22–32)
Calcium: 9.5 mg/dL (ref 8.9–10.3)
Chloride: 103 mmol/L (ref 101–111)
Creatinine, Ser: 0.92 mg/dL (ref 0.61–1.24)
GFR calc Af Amer: >60 mL/min (ref >60)
GFR calc non Af Amer: >60 mL/min (ref >60)
Glucose, Bld: 102 mg/dL — ABNORMAL HIGH (ref 65–99)
Potassium: 4.4 mmol/L (ref 3.5–5.1)
Sodium: 136 mmol/L (ref 135–145)
Total Bilirubin: 0.7 mg/dL (ref 0.3–1.2)
Total Protein: 6.9 g/dL (ref 6.5–8.1)

## 2016-10-08 LAB — LIPASE, BLOOD: Lipase: 36 U/L (ref 11–51)

## 2016-10-08 MED ORDER — HYDROMORPHONE HCL 1 MG/ML IJ SOLN
1.0000 mg | Freq: Once | INTRAMUSCULAR | Status: AC
  Administered 2016-10-08: 1 mg via INTRAVENOUS
  Filled 2016-10-08: qty 1

## 2016-10-08 MED ORDER — PROMETHAZINE HCL 25 MG/ML IJ SOLN
12.5000 mg | Freq: Once | INTRAMUSCULAR | Status: AC
  Administered 2016-10-08: 12.5 mg via INTRAVENOUS
  Filled 2016-10-08: qty 1

## 2016-10-08 MED ORDER — LIDOCAINE-EPINEPHRINE (PF) 1 %-1:200000 IJ SOLN
10.0000 mL | Freq: Once | INTRAMUSCULAR | Status: DC
  Filled 2016-10-08: qty 30

## 2016-10-08 MED ORDER — SODIUM CHLORIDE 0.9 % IV BOLUS (SEPSIS)
1000.0000 mL | Freq: Once | INTRAVENOUS | Status: AC
  Administered 2016-10-08: 1000 mL via INTRAVENOUS

## 2016-10-08 NOTE — ED Triage Notes (Signed)
Patient complains of body aches, nausea, diarrhea, and vomiting. States abdominal pain starting Sunday.

## 2016-10-08 NOTE — Discharge Instructions (Signed)
You may bathe and clean sutured wound with soap and water gently. Do not soak it though. You need to have your sutures removed in 7-10 days.

## 2016-10-08 NOTE — ED Provider Notes (Signed)
[facility]-EMER[facility]Y DEPT Provider Note   CSN: [id number] Arrival date & time: 10/08/16  0930  By signing my name below, I, Sonum Patel, attest that this documentation has been prepared under the direction and in the presence of Raeford Razor, MD. Electronically Signed: Sonum Patel, Neurosurgeon. 10/08/16. 11:27 AM.  History   Chief Complaint Chief Complaint  Patient presents with  . Nausea  . Emesis    The history is provided by the patient. No language interpreter was used.     HPI Comments: Kevin Moran is a 51 y.o. male who presents to the Emergency Department complaining of intermittent, unchanged episodes of nausea and vomiting with associated diarrhea and generalized body aches. He has not tried any OTC medications for his symptoms. He denies urinary symptoms, hematuria, blood in stool.   He also complains of a knot to the left upper arm that has gradually worsened over a few weeks. He states it is painful at times.   History reviewed. No pertinent past medical history.  Patient Active Problem List   Diagnosis Date Noted  . Left calcaneal fracture 07/14/2011  . Closed right pilon fracture 07/14/2011    History reviewed. No pertinent surgical history.     Home Medications    Prior to Admission medications   Not on File    Family History No family history on file.  Social History Social History  Substance Use Topics  . Smoking status: Light Tobacco Smoker    Types: Cigarettes  . Smokeless tobacco: Never Used  . Alcohol use Yes     Comment: occ     Allergies   Patient has no known allergies.   Review of Systems Review of Systems  A complete 10 system review of systems was obtained and all systems are negative except as noted in the HPI and PMH.    Physical Exam Updated Vital Signs BP 122/80 (BP Location: Right Arm)   Pulse 73   Temp 97.9 F (36.6 C) (Oral)   Resp 16   Ht 6\' 1"  (1.854 m)   Wt 160 lb (72.6 kg)   SpO2 99%   BMI 21.11 kg/m   Physical  Exam  Constitutional: He is oriented to person, place, and time. He appears well-developed and well-nourished.  HENT:  Head: Normocephalic and atraumatic.  Eyes: EOM are normal.  Neck: Normal range of motion.  Cardiovascular: Normal rate, regular rhythm, normal heart sounds and intact distal pulses.   Pulmonary/Chest: Effort normal and breath sounds normal. No respiratory distress.  Abdominal: Soft. He exhibits no distension. There is no tenderness. There is no rebound and no guarding.  Musculoskeletal: Normal range of motion.  Neurological: He is alert and oriented to person, place, and time.  Skin: Skin is warm and dry.  1 cm epidermoid cyst to lateral aspect of left shoulder. Doesn't appear infected.   Psychiatric: He has a normal mood and affect. Judgment normal.  Nursing note and vitals reviewed.    ED Treatments / Results  DIAGNOSTIC STUDIES: Oxygen Saturation is 99% on RA, normal by my interpretation.    COORDINATION OF CARE: 11:27 AM Discussed treatment plan with pt at bedside and pt agreed to plan.   Labs (all labs ordered are listed, but only abnormal results are displayed) Labs Reviewed  COMPREHENSIVE METABOLIC PANEL - Abnormal; Notable for the following:       Result Value   Glucose, Bld 102 (*)    ALT 16 (*)    Anion gap 4 (*)  All other components within normal limits  URINALYSIS, ROUTINE W REFLEX MICROSCOPIC - Abnormal; Notable for the following:    Color, Urine STRAW (*)    All other components within normal limits  LIPASE, BLOOD  CBC    EKG  EKG Interpretation None       Radiology No results found.  Procedures Procedures (including critical care time)  Excision of Sebaceous Cyst  Pt was placed supine. Area prepped and draped in usual manner. 1.5cc of 1% lidocaine w/o epi was infiltrated around cyst area. A small incision was made over the cyst. Contents expressed. The cyst wall detached quitely easily. It was removed in it's entirety. The  small resultant cavity was gentle irrigated with saline. The incision was closed with a single simple interrupted suture with 4-0 prolene. Minimal bleeding. No apparent complications.    Medications Ordered in ED Medications  promethazine (PHENERGAN) injection 12.5 mg (12.5 mg Intravenous Given 10/08/16 1204)  sodium chloride 0.9 % bolus 1,000 mL (0 mLs Intravenous Stopped 10/08/16 1325)  HYDROmorphone (DILAUDID) injection 1 mg (1 mg Intravenous Given 10/08/16 1204)     Initial Impression / Assessment and Plan / ED Course  I have reviewed the triage vital signs and the nursing notes.  Pertinent labs & imaging results that were available during my care of the patient were reviewed by me and considered in my medical decision making (see chart for details).     50yM with nausea and vomiting. None in ED. Abdominal exam pretty benign. H also requested that I look at a lesion on L shoulder which was a sebaceous cyst. I offered to excise this which he consented to. Continued wound care discussed as well as return precations with regards to all complaints. .   Final Clinical Impressions(s) / ED Diagnoses   Final diagnoses:  Nausea vomiting and diarrhea  Epidermoid cyst    New Prescriptions New Prescriptions   No medications on file   I personally preformed the services scribed in my presence. The recorded information has been reviewed is accurate. Raeford RazorStephen Akhila Mahnken, MD.    Raeford RazorStephen Ed Mandich, MD 10/13/16 [phone]

## 2022-02-14 ENCOUNTER — Ambulatory Visit (INDEPENDENT_AMBULATORY_CARE_PROVIDER_SITE_OTHER): Payer: 59 | Admitting: Internal Medicine

## 2022-02-14 ENCOUNTER — Encounter: Payer: Self-pay | Admitting: Internal Medicine

## 2022-02-14 VITALS — BP 104/76 | HR 103 | Temp 97.8°F | Resp 16 | Ht 73.5 in | Wt 157.6 lb

## 2022-02-14 DIAGNOSIS — Z13220 Encounter for screening for lipoid disorders: Secondary | ICD-10-CM | POA: Diagnosis not present

## 2022-02-14 DIAGNOSIS — Z130 Encounter for screening for diseases of the blood and blood-forming organs and certain disorders involving the immune mechanism: Secondary | ICD-10-CM

## 2022-02-14 DIAGNOSIS — Z114 Encounter for screening for human immunodeficiency virus [HIV]: Secondary | ICD-10-CM

## 2022-02-14 DIAGNOSIS — Z13228 Encounter for screening for other metabolic disorders: Secondary | ICD-10-CM | POA: Diagnosis not present

## 2022-02-14 DIAGNOSIS — Z1211 Encounter for screening for malignant neoplasm of colon: Secondary | ICD-10-CM

## 2022-02-14 DIAGNOSIS — Z1159 Encounter for screening for other viral diseases: Secondary | ICD-10-CM

## 2022-02-14 DIAGNOSIS — R1013 Epigastric pain: Secondary | ICD-10-CM | POA: Diagnosis not present

## 2022-02-14 DIAGNOSIS — Z125 Encounter for screening for malignant neoplasm of prostate: Secondary | ICD-10-CM

## 2022-02-14 DIAGNOSIS — Z23 Encounter for immunization: Secondary | ICD-10-CM

## 2022-02-14 MED ORDER — PANTOPRAZOLE SODIUM 40 MG PO TBEC: 40.0000 mg | DELAYED_RELEASE_TABLET | Freq: Every day | ORAL | 3 refills | Status: AC

## 2022-02-14 NOTE — Patient Instructions (Addendum)
It was great seeing you today!  Plan discussed at today's visit: -Blood work ordered today, results will be uploaded to MyChart.  -Try Pantoprazole 40 mg daily to help with acid reflux, let me know if this gives you any side effects -Abdominal ultrasound ordered to further assess liver, gallbladder, pancreas -Tdap today  -Referral placed to GI to discuss colonoscopy for colon cancer screening   Follow up in: 1 month   Take care and let us know if you have any questions or concerns prior to your next visit.  Dr. Caralee Ates

## 2022-02-14 NOTE — Progress Notes (Signed)
New Patient Office Visit  Subjective    Patient ID: Kevin Moran, male    DOB: 1966/01/08  Age: 56 y.o. MRN: [id number]  CC:  Chief Complaint  Patient presents with   Establish Care   Gastroesophageal Reflux    Vomiting, nauseous, diarrhea, upper stomach pain    HPI MAURION WALKOWIAK presents to establish care. He has not been diagnosed with any chronic medical conditions. Does not take any daily medications.   Abdominal Complaint: -Duration:  6 month  -Frequency: constant -Nature: aching -Location: RUQ and epigastric  -Severity: moderate  -Radiation: no -Alleviating factors: Nothing -Aggravating factors: hot sauce, fried foods, orange juice, cheese, dairy, coffee  -Treatments attempted: Pepto and Nexium unsuccessful  -Constipation: no -Diarrhea: yes  -Episodes of diarrhea/day: 3 watery stools a week  -Mucous in the stool: no -Heartburn: no -Bloating:yes -Passing Gas: yes -Nausea: yes -Vomiting: yes -Episodes of vomit/day: 3 times a week - clear, bubbly  -Melena or hematochezia: no -Rash: no -Fever: no -Weight loss: no -Change in Appetite: no  Health Maintenance: -Blood work due -Colon cancer screening due -Tdap due   No outpatient encounter medications on file as of 02/14/2022.   No facility-administered encounter medications on file as of 02/14/2022.    History reviewed. No pertinent past medical history.  Past Surgical History:  Procedure Laterality Date   TOE SURGERY Left    big toe reattachment    Family History  Problem Relation Age of Onset   Aneurysm Mother    Cancer Father    COPD Brother     Social History   Socioeconomic History   Marital status: Single    Spouse name: Not on file   Number of children: Not on file   Years of education: Not on file   Highest education level: Not on file  Occupational History   Not on file  Tobacco Use   Smoking status: Light Smoker    Types: Cigarettes   Smokeless tobacco: Never  Vaping Use    Vaping Use: Never used  Substance and Sexual Activity   Alcohol use: Yes    Comment: occ   Drug use: No   Sexual activity: Yes  Other Topics Concern   Not on file  Social History Narrative   Not on file   Social Determinants of Health   Financial Resource Strain: Not on file  Food Insecurity: Not on file  Transportation Needs: Not on file  Physical Activity: Not on file  Stress: Not on file  Social Connections: Not on file  Intimate Partner Violence: Not on file    Review of Systems  Constitutional:  Negative for chills, fever, malaise/fatigue and weight loss.  Respiratory:  Negative for shortness of breath.   Cardiovascular:  Negative for chest pain.  Gastrointestinal:  Positive for abdominal pain, diarrhea, heartburn, nausea and vomiting. Negative for blood in stool and melena.  Genitourinary:  Negative for dysuria and hematuria.  Skin:  Negative for rash.  Neurological:  Negative for dizziness.        Objective    BP 104/76   Pulse (!) 103   Temp 97.8 F (36.6 C)   Resp 16   Ht 6' 1.5" (1.867 m)   Wt 157 lb 9.6 oz (71.5 kg)   SpO2 96%   BMI 20.51 kg/m   Physical Exam Constitutional:      Appearance: Normal appearance.  HENT:     Head: Normocephalic and atraumatic.  Eyes:  Conjunctiva/sclera: Conjunctivae normal.  Cardiovascular:     Rate and Rhythm: Normal rate and regular rhythm.  Pulmonary:     Effort: Pulmonary effort is normal.     Breath sounds: Normal breath sounds.  Abdominal:     General: Bowel sounds are normal. There is no distension.     Palpations: Abdomen is soft.     Tenderness: There is abdominal tenderness. There is no right CVA tenderness, left CVA tenderness, guarding or rebound.     Comments: Tenderness to epigastric and RUQ palpation, Murphy's sign negative.   Neurological:     Mental Status: He is alert.     Last CBC Lab Results  Component Value Date   WBC 7.3 10/08/2016   HGB 14.3 10/08/2016   HCT 43.7 10/08/2016    MCV 89.9 10/08/2016   MCH 29.4 10/08/2016   RDW 13.9 10/08/2016   PLT 229 10/08/2016   Last metabolic panel Lab Results  Component Value Date   GLUCOSE 102 (H) 10/08/2016   NA 136 10/08/2016   K 4.4 10/08/2016   CL 103 10/08/2016   CO2 29 10/08/2016   BUN 14 10/08/2016   CREATININE 0.92 10/08/2016   GFRNONAA >60 10/08/2016   CALCIUM 9.5 10/08/2016   PROT 6.9 10/08/2016   ALBUMIN 4.1 10/08/2016   BILITOT 0.7 10/08/2016   ALKPHOS 45 10/08/2016   AST 20 10/08/2016   ALT 16 (L) 10/08/2016   ANIONGAP 4 (L) 10/08/2016   Last lipids No results found for: "CHOL", "HDL", "LDLCALC", "LDLDIRECT", "TRIG", "CHOLHDL" Last hemoglobin A1c No results found for: "HGBA1C" Last thyroid functions No results found for: "TSH", "T3TOTAL", "T4TOTAL", "THYROIDAB" Last vitamin D No results found for: "25OHVITD2", "25OHVITD3", "VD25OH" Last vitamin B12 and Folate No results found for: "VITAMINB12", "FOLATE"      Assessment & Plan:   1. Epigastric pain: Consistent with acid reflux but also RUQ pain on exam. Will obtain CBC, CMP, lipase today. Treat with Pantoprazole 40 mg daily, recheck in 1 month. Obtain Abdominal US today as well too to further evaluate gallbladder, liver. Discussed foods to avoid, avoiding laying flat within 3-4 after eating,etc.   - CBC w/Diff/Platelet - COMPLETE METABOLIC PANEL WITH GFR - Lipase - pantoprazole (PROTONIX) 40 MG tablet; Take 1 tablet (40 mg total) by mouth daily.  Dispense: 30 tablet; Refill: 3 - US Abdomen Complete; Future  2. Screening for metabolic disorder/Screening for deficiency anemia/Lipid screening/Prostate cancer screening/Screening for HIV without presence of risk factors/Encounter for hepatitis C screening test for low risk patient: Fasting screening labs due.   - COMPLETE METABOLIC PANEL WITH GFR - CBC w/Diff/Platelet - Lipid Profile - PSA - HIV antibody (with reflex) - Hepatitis C Antibody  3. Screening for colon cancer: Referral  placed to GI for colon cancer screening.   - Ambulatory referral to Gastroenterology  4. Need for Tdap vaccination: Tdap administered today.   - Tdap vaccine greater than or equal to 7yo IM  Return in about 4 weeks (around 03/14/2022).   Margarita Mail, DO

## 2022-02-15 LAB — COMPLETE METABOLIC PANEL WITH GFR
AG Ratio: 1.7 (calc) (ref 1.0–2.5)
ALT: 15 U/L (ref 9–46)
AST: 21 U/L (ref 10–35)
Albumin: 4.5 g/dL (ref 3.6–5.1)
Alkaline phosphatase (APISO): 59 U/L (ref 35–144)
BUN: 20 mg/dL (ref 7–25)
CO2: 28 mmol/L (ref 20–32)
Calcium: 10.2 mg/dL (ref 8.6–10.3)
Chloride: 105 mmol/L (ref 98–110)
Creat: 1.09 mg/dL (ref 0.70–1.30)
Globulin: 2.7 g/dL (calc) (ref 1.9–3.7)
Glucose, Bld: 90 mg/dL (ref 65–99)
Potassium: 4.7 mmol/L (ref 3.5–5.3)
Sodium: 140 mmol/L (ref 135–146)
Total Bilirubin: 0.5 mg/dL (ref 0.2–1.2)
Total Protein: 7.2 g/dL (ref 6.1–8.1)
eGFR: 81 mL/min/{1.73_m2} (ref >=60)

## 2022-02-15 LAB — CBC WITH DIFFERENTIAL/PLATELET
Absolute Monocytes: 567 cells/uL (ref 200–950)
Basophils Absolute: 67 cells/uL (ref 0–200)
Basophils Relative: 1.1 %
Eosinophils Absolute: 92 cells/uL (ref 15–500)
Eosinophils Relative: 1.5 %
HCT: 46.4 % (ref 38.5–50.0)
Hemoglobin: 15.2 g/dL (ref 13.2–17.1)
Lymphs Abs: 1775 cells/uL (ref 850–3900)
MCH: 29.5 pg (ref 27.0–33.0)
MCHC: 32.8 g/dL (ref 32.0–36.0)
MCV: 90.1 fL (ref 80.0–100.0)
MPV: 13.1 fL — ABNORMAL HIGH (ref 7.5–12.5)
Monocytes Relative: 9.3 %
Neutro Abs: 3599 cells/uL (ref 1500–7800)
Neutrophils Relative %: 59 %
Platelets: 227 10*3/uL (ref 140–400)
RBC: 5.15 10*6/uL (ref 4.20–5.80)
RDW: 12.8 % (ref 11.0–15.0)
Total Lymphocyte: 29.1 %
WBC: 6.1 10*3/uL (ref 3.8–10.8)

## 2022-02-15 LAB — LIPID PANEL
Cholesterol: 229 mg/dL — ABNORMAL HIGH (ref <200)
HDL: 88 mg/dL (ref >=40)
LDL Cholesterol (Calc): 127 mg/dL (calc) — ABNORMAL HIGH
Non-HDL Cholesterol (Calc): 141 mg/dL (calc) — ABNORMAL HIGH (ref <130)
Total CHOL/HDL Ratio: 2.6 (calc) (ref <5.0)
Triglycerides: 58 mg/dL (ref <150)

## 2022-02-15 LAB — PSA: PSA: 0.64 ng/mL (ref <=4.00)

## 2022-02-15 LAB — HEPATITIS C ANTIBODY
Hepatitis C Ab: NONREACTIVE
SIGNAL TO CUT-OFF: 0.17 (ref <1.00)

## 2022-02-15 LAB — HIV ANTIBODY (ROUTINE TESTING W REFLEX): HIV 1&2 Ab, 4th Generation: NONREACTIVE

## 2022-02-15 LAB — LIPASE: Lipase: 32 U/L (ref 7–60)

## 2022-02-19 ENCOUNTER — Telehealth: Payer: Self-pay

## 2022-02-19 NOTE — Telephone Encounter (Signed)
CALLED PATIENT NO ANSWER LEFT VOICEMAIL FOR A CALL BACK °Letter sent °

## 2022-02-21 ENCOUNTER — Ambulatory Visit (HOSPITAL_COMMUNITY)
Admission: RE | Admit: 2022-02-21 | Discharge: 2022-02-21 | Disposition: A | Discharge status: Home / Self Care | Payer: Medicaid Other | Source: Ambulatory Visit | Attending: Internal Medicine | Admitting: Internal Medicine

## 2022-02-21 DIAGNOSIS — R1013 Epigastric pain: Secondary | ICD-10-CM

## 2022-03-13 NOTE — Progress Notes (Deleted)
Established Patient Office Visit  Subjective    Patient ID: Kevin Moran, Kevin Moran    DOB: [birth year]  Age: 56 y.o. MRN: [id number]  CC:  No chief complaint on file.   HPI Kevin Moran presents for follow-up  Epigastric pain: -Started on Protonix 40 mg at last office visit -CMP, lipase unremarkable -Abdominal ultrasound done on 02/21/2022 showing probable mild hepatic steatosis without other abnormalities identified.  Health Maintenance: -Blood work up-to-date -Colon cancer screening due -referral placed to GI for colonoscopy   Outpatient Encounter Medications as of 03/14/2022  Medication Sig   pantoprazole (PROTONIX) 40 MG tablet Take 1 tablet (40 mg total) by mouth daily.   No facility-administered encounter medications on file as of 03/14/2022.    No past medical history on file.  Past Surgical History:  Procedure Laterality Date   TOE SURGERY Left    big toe reattachment    Family History  Problem Relation Age of Onset   Aneurysm Mother    Cancer Father    COPD Brother     Social History   Socioeconomic History   Marital status: Single    Spouse name: Not on file   Number of children: Not on file   Years of education: Not on file   Highest education level: Not on file  Occupational History   Not on file  Tobacco Use   Smoking status: Light Smoker    Types: Cigarettes   Smokeless tobacco: Never  Vaping Use   Vaping Use: Never used  Substance and Sexual Activity   Alcohol use: Yes    Comment: occ   Drug use: No   Sexual activity: Yes  Other Topics Concern   Not on file  Social History Narrative   Not on file   Social Determinants of Health   Financial Resource Strain: Not on file  Food Insecurity: Not on file  Transportation Needs: Not on file  Physical Activity: Not on file  Stress: Not on file  Social Connections: Not on file  Intimate Partner Violence: Not on file    Review of Systems  Constitutional:  Negative for chills, fever,  malaise/fatigue and weight loss.  Respiratory:  Negative for shortness of breath.   Cardiovascular:  Negative for chest pain.  Gastrointestinal:  Positive for abdominal pain, diarrhea, heartburn, nausea and vomiting. Negative for blood in stool and melena.  Genitourinary:  Negative for dysuria and hematuria.  Skin:  Negative for rash.  Neurological:  Negative for dizziness.        Objective    There were no vitals taken for this visit.  Physical Exam Constitutional:      Appearance: Normal appearance.  HENT:     Head: Normocephalic and atraumatic.  Eyes:     Conjunctiva/sclera: Conjunctivae normal.  Cardiovascular:     Rate and Rhythm: Normal rate and regular rhythm.  Pulmonary:     Effort: Pulmonary effort is normal.     Breath sounds: Normal breath sounds.  Abdominal:     General: Bowel sounds are normal. There is no distension.     Palpations: Abdomen is soft.     Tenderness: There is abdominal tenderness. There is no right CVA tenderness, left CVA tenderness, guarding or rebound.     Comments: Tenderness to epigastric and RUQ palpation, Murphy's sign negative.   Neurological:     Mental Status: He is alert.     Last CBC Lab Results  Component Value Date   WBC 6.1 02/14/2022  HGB 15.2 02/14/2022   HCT 46.4 02/14/2022   MCV 90.1 02/14/2022   MCH 29.5 02/14/2022   RDW 12.8 02/14/2022   PLT 227 02/14/2022   Last metabolic panel Lab Results  Component Value Date   GLUCOSE 90 02/14/2022   NA 140 02/14/2022   K 4.7 02/14/2022   CL 105 02/14/2022   CO2 28 02/14/2022   BUN 20 02/14/2022   CREATININE 1.09 02/14/2022   GFRNONAA >60 10/08/2016   CALCIUM 10.2 02/14/2022   PROT 7.2 02/14/2022   ALBUMIN 4.1 10/08/2016   BILITOT 0.5 02/14/2022   ALKPHOS 45 10/08/2016   AST 21 02/14/2022   ALT 15 02/14/2022   ANIONGAP 4 (L) 10/08/2016   Last lipids Lab Results  Component Value Date   CHOL 229 (H) 02/14/2022   HDL 88 02/14/2022   LDLCALC 127 (H) 02/14/2022    TRIG 58 02/14/2022   CHOLHDL 2.6 02/14/2022   Last hemoglobin A1c No results found for: "HGBA1C" Last thyroid functions No results found for: "TSH", "T3TOTAL", "T4TOTAL", "THYROIDAB" Last vitamin D No results found for: "25OHVITD2", "25OHVITD3", "VD25OH" Last vitamin B12 and Folate No results found for: "VITAMINB12", "FOLATE"      Assessment & Plan:   1. Epigastric pain: Consistent with acid reflux but also RUQ pain on exam. Will obtain CBC, CMP, lipase today. Treat with Pantoprazole 40 mg daily, recheck in 1 month. Obtain Abdominal US today as well too to further evaluate gallbladder, liver. Discussed foods to avoid, avoiding laying flat within 3-4 after eating,etc.   - CBC w/Diff/Platelet - COMPLETE METABOLIC PANEL WITH GFR - Lipase - pantoprazole (PROTONIX) 40 MG tablet; Take 1 tablet (40 mg total) by mouth daily.  Dispense: 30 tablet; Refill: 3 - US Abdomen Complete; Future  2. Screening for metabolic disorder/Screening for deficiency anemia/Lipid screening/Prostate cancer screening/Screening for HIV without presence of risk factors/Encounter for hepatitis C screening test for low risk patient: Fasting screening labs due.   - COMPLETE METABOLIC PANEL WITH GFR - CBC w/Diff/Platelet - Lipid Profile - PSA - HIV antibody (with reflex) - Hepatitis C Antibody  3. Screening for colon cancer: Referral placed to GI for colon cancer screening.   - Ambulatory referral to Gastroenterology  4. Need for Tdap vaccination: Tdap administered today.   - Tdap vaccine greater than or equal to 7yo IM  No follow-ups on file.   Margarita Mail, DO

## 2022-03-14 ENCOUNTER — Ambulatory Visit: Payer: 59 | Admitting: Internal Medicine

## 2022-03-18 ENCOUNTER — Ambulatory Visit (INDEPENDENT_AMBULATORY_CARE_PROVIDER_SITE_OTHER): Payer: 59 | Admitting: Internal Medicine

## 2022-03-18 ENCOUNTER — Encounter: Payer: Self-pay | Admitting: Internal Medicine

## 2022-03-18 VITALS — BP 122/80 | HR 92 | Temp 98.4°F | Resp 16 | Ht 73.5 in | Wt 153.7 lb

## 2022-03-18 DIAGNOSIS — Z1211 Encounter for screening for malignant neoplasm of colon: Secondary | ICD-10-CM | POA: Diagnosis not present

## 2022-03-18 DIAGNOSIS — N529 Male erectile dysfunction, unspecified: Secondary | ICD-10-CM

## 2022-03-18 DIAGNOSIS — E782 Mixed hyperlipidemia: Secondary | ICD-10-CM | POA: Diagnosis not present

## 2022-03-18 DIAGNOSIS — K219 Gastro-esophageal reflux disease without esophagitis: Secondary | ICD-10-CM | POA: Insufficient documentation

## 2022-03-18 MED ORDER — TADALAFIL 10 MG PO TABS: 10.0000 mg | ORAL_TABLET | ORAL | 0 refills | Status: DC | PRN

## 2022-03-18 NOTE — Patient Instructions (Addendum)
It was great seeing you today!  Plan discussed at today's visit: -Continue Pantoprazole as needed -Work on decreasing red meats and fatty processed foods in the diet -Try to eat more protein, list of foods below -Referral to GI for colonoscopy ordered  Follow up in: 3 months   Take care and let us know if you have any questions or concerns prior to your next visit.  Dr. Caralee Ates  Protein Content in Foods Protein is a necessary nutrient in any diet. It helps build and repair muscles, bones, and skin. Depending on your overall health, you may need more or less protein in your diet. You are encouraged to eat a variety of protein foods to ensure that you get all the essential nutrients that are found in different protein foods. Talk with your health care provider or dietitian about how much protein you need each day and which sources of protein are best for you. Protein is especially important for: Repairing and making cells and tissues. Fighting infection. Providing energy. Growth and development. See the following list for the protein content of some common foods. What are tips for getting more protein in your diet? Try to replace processed carbohydrates with high-quality protein. Snack on nuts and seeds instead of chips. Replace baked desserts with [place] yogurt. Eat protein foods from both plant and animal sources. Replace red meat with seafood. Add beans and peas to salads, soups, and side dishes. Include a protein food with each meal and snack. Reading food labels You can find the amount of protein in a food item by looking at the nutrition facts label. Use the total grams listed to help you reach your daily goal. What foods are high in protein?  High-protein foods contain 4 grams (g) or more of protein per serving. They include: Grains Quinoa (cooked) -- 1 cup (185 g) has 8 g of protein. Whole wheat pasta (cooked) -- 1 cup (140 g) has 6 g of protein. Meat Beef, ground sirloin  (cooked) -- 3 oz (85 g) has 24 g of protein. Chicken breast, boneless and skinless (cooked) -- 3 oz (85 g) has 25 g of protein. Egg -- 1 egg has 6 g of protein. Fish, filet (cooked) -- 1 oz (28 g) has 6-7 g of protein. Lamb (cooked) -- 3 oz (85 g) has 24 g of protein. Pork tenderloin (cooked) -- 3 oz (85 g) has 23 g of protein. Tuna (canned in water) -- 3 oz (85 g) has 20 g of protein. Dairy Cottage cheese --  cup (114 g) has 13.4 g of protein. Milk -- 1 cup (237 mL) has 8 g of protein. Cheese (hard) -- 1 oz (28 g) has 7 g of protein. Yogurt, regular -- 6 oz (170 g) has 8 g of protein. Greek yogurt -- 6 oz (200 g) has 18 g protein. Plant protein Garbanzo beans (canned or cooked) --  cup (130 g) has 6-7 g of protein. Kidney beans (canned or cooked) --  cup (130 g) has 6-7 g of protein. Nuts (peanuts, pistachios, almonds) -- 1 oz (28 g) has 6 g of protein. Peanut butter -- 1 oz (32 g) has 7-8 g of protein. Pumpkin seeds -- 1 oz (28 g) has 8.5 g of protein. Soybeans (roasted) -- 1 oz (28 g) has 8 g of protein. Soybeans (cooked) --  cup (90 g) has 11 g of protein. Soy milk -- 1 cup (250 mL) has 5-10 g of protein. Soy or vegetable patty -- 1  patty has 11 g of protein. Sunflower seeds -- 1 oz (28 g) has 5.5 g of protein. Buckwheat -- 1 oz (33 g) has 4.3 g of protein. Tofu (firm) --  cup (124 g) has 20 g of protein. Tempeh --  cup (83 g) has 16 g of protein. The items listed above may not be a complete list of foods high in protein. Actual amounts of protein may differ depending on processing. Contact a dietitian for more information. What foods are low in protein?  Low-protein foods contain 3 grams (g) or less of protein per serving. They include: Fruits Fruit or vegetable juice --  cup (125 mL) has 1 g of protein. Vegetables Beets (raw or cooked) --  cup (68 g) has 1.5 g of protein. Broccoli (raw or cooked) --  cup (44 g) has 2 g of protein. Collard greens (raw or cooked) --   cup (42 g) has 2 g of protein. Green beans (raw or cooked) --  cup (83 g) has 1 g of protein. Green peas (canned) --  cup (80 g) has 3.5 g of protein. Potato (baked with skin) -- 1 medium potato (173 g) has 3 g of protein. Spinach (cooked) --  cup (90 g) has 3 g of protein. Squash (cooked) --  cup (90 g) has 1.5 g of protein. Avocado -- 1 cup (146 g) has 2.7 g of protein. Grains Bran cereal --  cup (30 g) has 2-3 g of protein. Bread -- 1 slice has 2.5 g of protein. Corn (fresh or cooked) --  cup (77 g) has 2 g of protein. Flour tortilla -- One 6-inch (15 cm) tortilla has 2.5 g of protein. Muffins -- 1 small muffin (2 oz or 57 g) has 3 g of protein. Oatmeal (cooked) --  cup (40 g) has 3 g of protein. Rice (cooked) --  cup (79 g) has 2.5-3.5 g of protein. Dairy Cream cheese -- 1 oz (29 g) has 2 g of protein. Creamer (half-and-half) -- 1 oz (29 mL) has 1 g of protein. Frozen yogurt --  cup (72 g) has 3 g of protein. Sour cream --  cup (75 g) has 2.5 g of protein. The items listed above may not be a complete list of foods low in protein. Actual amounts of protein may differ depending on processing. Contact a dietitian for more information. Summary Protein is a nutrient that your body needs for growth and development, repairing and making cells and tissues, fighting infection, and providing energy. Protein is in both plant and animal foods. Some of these foods have more protein than others. Depending on your overall health, you may need more or less protein in your diet. Talk to your health care provider about how much protein you need. This information is not intended to replace advice given to you by your health care provider. Make sure you discuss any questions you have with your health care provider. Document Revised: 07/24/2020 Document Reviewed: 07/24/2020 Elsevier Patient Education  2023 [employer].

## 2022-03-18 NOTE — Progress Notes (Signed)
Established Patient Office Visit  Subjective    Patient ID: Kevin Moran, male    DOB: Jan 05, 1966  Age: 56 y.o. MRN: [id number]  CC:  Chief Complaint  Patient presents with   Follow-up   Abdominal Pain    Epigastric- medication working great!    HPI Kevin Moran presents for follow-up  Epigastric pain: -Started on Protonix 40 mg at last office visit, which he states is helping greatly and his symptoms have resolved. -CMP, lipase from last visit unremarkable -Abdominal ultrasound done on 02/21/2022 showing probable mild hepatic steatosis without other abnormalities identified.  HLD: -Medications: None currently -Last lipid panel: Lipid Panel     Component Value Date/Time   CHOL 229 (H) 02/14/2022 0957   TRIG 58 02/14/2022 0957   HDL 88 02/14/2022 0957   CHOLHDL 2.6 02/14/2022 0957   LDLCALC 127 (H) 02/14/2022 0957   The 10-year ASCVD risk score (Arnett DK, et al., 2019) is: 8.4%   Values used to calculate the score:     Age: 71 years     Sex: Male     Is Non-Hispanic African American: Yes     Diabetic: No     Tobacco smoker: Yes     Systolic Blood Pressure: 122 mmHg     Is BP treated: No     HDL Cholesterol: 88 mg/dL     Total Cholesterol: 229 mg/dL  Health Maintenance: -Blood work up-to-date -Colon cancer screening due -referral placed to GI for colonoscopy   Outpatient Encounter Medications as of 03/18/2022  Medication Sig   pantoprazole (PROTONIX) 40 MG tablet Take 1 tablet (40 mg total) by mouth daily.   No facility-administered encounter medications on file as of 03/18/2022.    History reviewed. No pertinent past medical history.  Past Surgical History:  Procedure Laterality Date   TOE SURGERY Left    big toe reattachment    Family History  Problem Relation Age of Onset   Aneurysm Mother    Cancer Father    COPD Brother     Social History   Socioeconomic History   Marital status: Single    Spouse name: Not on file   Number of  children: Not on file   Years of education: Not on file   Highest education level: Not on file  Occupational History   Not on file  Tobacco Use   Smoking status: Light Smoker    Types: Cigarettes   Smokeless tobacco: Never  Vaping Use   Vaping Use: Never used  Substance and Sexual Activity   Alcohol use: Yes    Comment: occ   Drug use: No   Sexual activity: Yes  Other Topics Concern   Not on file  Social History Narrative   Not on file   Social Determinants of Health   Financial Resource Strain: Not on file  Food Insecurity: Not on file  Transportation Needs: Not on file  Physical Activity: Not on file  Stress: Not on file  Social Connections: Not on file  Intimate Partner Violence: Not on file    Review of Systems  Constitutional:  Negative for chills and fever.  Respiratory:  Negative for shortness of breath.   Cardiovascular:  Negative for chest pain.  Gastrointestinal:  Negative for abdominal pain, nausea and vomiting.      Objective    BP 122/80   Pulse 92   Temp 98.4 F (36.9 C)   Resp 16   Ht 6' 1.5" (1.867 m)  Wt 153 lb 11.2 oz (69.7 kg)   SpO2 100%   BMI 20.00 kg/m   Physical Exam Constitutional:      Appearance: Normal appearance.  HENT:     Head: Normocephalic and atraumatic.  Eyes:     Conjunctiva/sclera: Conjunctivae normal.  Cardiovascular:     Rate and Rhythm: Normal rate and regular rhythm.  Pulmonary:     Effort: Pulmonary effort is normal.     Breath sounds: Normal breath sounds.  Musculoskeletal:     Right lower leg: No edema.     Left lower leg: No edema.  Skin:    General: Skin is warm and dry.  Neurological:     General: No focal deficit present.     Mental Status: He is alert. Mental status is at baseline.  Psychiatric:        Mood and Affect: Mood normal.        Behavior: Behavior normal.     Last CBC Lab Results  Component Value Date   WBC 6.1 02/14/2022   HGB 15.2 02/14/2022   HCT 46.4 02/14/2022   MCV  90.1 02/14/2022   MCH 29.5 02/14/2022   RDW 12.8 02/14/2022   PLT 227 02/14/2022   Last metabolic panel Lab Results  Component Value Date   GLUCOSE 90 02/14/2022   NA 140 02/14/2022   K 4.7 02/14/2022   CL 105 02/14/2022   CO2 28 02/14/2022   BUN 20 02/14/2022   CREATININE 1.09 02/14/2022   GFRNONAA >60 10/08/2016   CALCIUM 10.2 02/14/2022   PROT 7.2 02/14/2022   ALBUMIN 4.1 10/08/2016   BILITOT 0.5 02/14/2022   ALKPHOS 45 10/08/2016   AST 21 02/14/2022   ALT 15 02/14/2022   ANIONGAP 4 (L) 10/08/2016   Last lipids Lab Results  Component Value Date   CHOL 229 (H) 02/14/2022   HDL 88 02/14/2022   LDLCALC 127 (H) 02/14/2022   TRIG 58 02/14/2022   CHOLHDL 2.6 02/14/2022   Last hemoglobin A1c No results found for: "HGBA1C" Last thyroid functions No results found for: "TSH", "T3TOTAL", "T4TOTAL", "THYROIDAB" Last vitamin D No results found for: "25OHVITD2", "25OHVITD3", "VD25OH" Last vitamin B12 and Folate No results found for: "VITAMINB12", "FOLATE"      Assessment & Plan:   1. Moderate mixed hyperlipidemia not requiring statin therapy: Reviewed last lipid panel in depth with the patient, LDL elevated.  ASCVD risk borderline at 8.3%.  Discussed benefits and risks of starting statin medication.  At this point patient will work on diet, decrease red meats and fatty processed foods in the diet with plans to recheck fasting in 1 year.  2. Gastroesophageal reflux disease, unspecified whether esophagitis present: Abdominal symptoms resolved, no nausea, vomiting or abdominal pain.  Continue pantoprazole 40 mg as needed.  Continue to avoid trigger foods.  3. Erectile dysfunction, unspecified erectile dysfunction type: Patient having difficulty initiating and maintaining erection, will trial Cialis 10 mg 30 minutes prior to sexual activity.  - tadalafil (CIALIS) 10 MG tablet; Take 1 tablet (10 mg total) by mouth every other day as needed for erectile dysfunction.  Dispense: 30  tablet; Refill: 0  4. Colon cancer screening: Referral placed to GI for colon cancer screening.  - Ambulatory referral to Gastroenterology   Return in about 3 months (around 06/18/2022).   Margarita Mail, DO

## 2022-06-18 ENCOUNTER — Ambulatory Visit: Payer: 59 | Admitting: Internal Medicine

## 2022-06-18 NOTE — Progress Notes (Deleted)
Established Patient Office Visit  Subjective    Patient ID: Kevin Moran, male    DOB: 07/30/1966  Age: 56 y.o. MRN: [id number]  CC:  No chief complaint on file.   HPI Kevin Moran presents for follow-up  Epigastric pain: -Started on Protonix 40 mg at last office visit, which he states is helping greatly and his symptoms have resolved. -CMP, lipase from last visit unremarkable -Abdominal ultrasound done on 02/21/2022 showing probable mild hepatic steatosis without other abnormalities identified.  HLD: -Medications: None currently -Last lipid panel: Lipid Panel     Component Value Date/Time   CHOL 229 (H) 02/14/2022 0957   TRIG 58 02/14/2022 0957   HDL 88 02/14/2022 0957   CHOLHDL 2.6 02/14/2022 0957   LDLCALC 127 (H) 02/14/2022 0957   The 10-year ASCVD risk score (Arnett DK, et al., 2019) is: 8.8%   Values used to calculate the score:     Age: 10 years     Sex: Male     Is Non-Hispanic African American: Yes     Diabetic: No     Tobacco smoker: Yes     Systolic Blood Pressure: 122 mmHg     Is BP treated: No     HDL Cholesterol: 88 mg/dL     Total Cholesterol: 229 mg/dL  Health Maintenance: -Blood work up-to-date -Colon cancer screening due -referral placed to GI for colonoscopy   Outpatient Encounter Medications as of 06/18/2022  Medication Sig   pantoprazole (PROTONIX) 40 MG tablet Take 1 tablet (40 mg total) by mouth daily.   tadalafil (CIALIS) 10 MG tablet Take 1 tablet (10 mg total) by mouth every other day as needed for erectile dysfunction.   No facility-administered encounter medications on file as of 06/18/2022.    No past medical history on file.  Past Surgical History:  Procedure Laterality Date   TOE SURGERY Left    big toe reattachment    Family History  Problem Relation Age of Onset   Aneurysm Mother    Cancer Father    COPD Brother     Social History   Socioeconomic History   Marital status: Single    Spouse name: Not on file    Number of children: Not on file   Years of education: Not on file   Highest education level: Not on file  Occupational History   Not on file  Tobacco Use   Smoking status: Light Smoker    Types: Cigarettes   Smokeless tobacco: Never  Vaping Use   Vaping Use: Never used  Substance and Sexual Activity   Alcohol use: Yes    Comment: occ   Drug use: No   Sexual activity: Yes  Other Topics Concern   Not on file  Social History Narrative   Not on file   Social Determinants of Health   Financial Resource Strain: Not on file  Food Insecurity: Not on file  Transportation Needs: Not on file  Physical Activity: Not on file  Stress: Not on file  Social Connections: Not on file  Intimate Partner Violence: Not on file    Review of Systems  Constitutional:  Negative for chills and fever.  Respiratory:  Negative for shortness of breath.   Cardiovascular:  Negative for chest pain.  Gastrointestinal:  Negative for abdominal pain, nausea and vomiting.      Objective    There were no vitals taken for this visit.  Physical Exam Constitutional:      Appearance:  Normal appearance.  HENT:     Head: Normocephalic and atraumatic.  Eyes:     Conjunctiva/sclera: Conjunctivae normal.  Cardiovascular:     Rate and Rhythm: Normal rate and regular rhythm.  Pulmonary:     Effort: Pulmonary effort is normal.     Breath sounds: Normal breath sounds.  Musculoskeletal:     Right lower leg: No edema.     Left lower leg: No edema.  Skin:    General: Skin is warm and dry.  Neurological:     General: No focal deficit present.     Mental Status: He is alert. Mental status is at baseline.  Psychiatric:        Mood and Affect: Mood normal.        Behavior: Behavior normal.     Last CBC Lab Results  Component Value Date   WBC 6.1 02/14/2022   HGB 15.2 02/14/2022   HCT 46.4 02/14/2022   MCV 90.1 02/14/2022   MCH 29.5 02/14/2022   RDW 12.8 02/14/2022   PLT 227 11/21/2246   Last  metabolic panel Lab Results  Component Value Date   GLUCOSE 90 02/14/2022   NA 140 02/14/2022   K 4.7 02/14/2022   CL 105 02/14/2022   CO2 28 02/14/2022   BUN 20 02/14/2022   CREATININE 1.09 02/14/2022   GFRNONAA >60 10/08/2016   CALCIUM 10.2 02/14/2022   PROT 7.2 02/14/2022   ALBUMIN 4.1 10/08/2016   BILITOT 0.5 02/14/2022   ALKPHOS 45 10/08/2016   AST 21 02/14/2022   ALT 15 02/14/2022   ANIONGAP 4 (L) 10/08/2016   Last lipids Lab Results  Component Value Date   CHOL 229 (H) 02/14/2022   HDL 88 02/14/2022   LDLCALC 127 (H) 02/14/2022   TRIG 58 02/14/2022   CHOLHDL 2.6 02/14/2022   Last hemoglobin A1c No results found for: "HGBA1C" Last thyroid functions No results found for: "TSH", "T3TOTAL", "T4TOTAL", "THYROIDAB" Last vitamin D No results found for: "25OHVITD2", "25OHVITD3", "VD25OH" Last vitamin B12 and Folate No results found for: "VITAMINB12", "FOLATE"      Assessment & Plan:   1. Moderate mixed hyperlipidemia not requiring statin therapy: Reviewed last lipid panel in depth with the patient, LDL elevated.  ASCVD risk borderline at 8.3%.  Discussed benefits and risks of starting statin medication.  At this point patient will work on diet, decrease red meats and fatty processed foods in the diet with plans to recheck fasting in 1 year.  2. Gastroesophageal reflux disease, unspecified whether esophagitis present: Abdominal symptoms resolved, no nausea, vomiting or abdominal pain.  Continue pantoprazole 40 mg as needed.  Continue to avoid trigger foods.  3. Erectile dysfunction, unspecified erectile dysfunction type: Patient having difficulty initiating and maintaining erection, will trial Cialis 10 mg 30 minutes prior to sexual activity.  - tadalafil (CIALIS) 10 MG tablet; Take 1 tablet (10 mg total) by mouth every other day as needed for erectile dysfunction.  Dispense: 30 tablet; Refill: 0  4. Colon cancer screening: Referral placed to GI for colon cancer  screening.  - Ambulatory referral to Gastroenterology   No follow-ups on file.   Teodora Medici, DO

## 2022-06-25 ENCOUNTER — Ambulatory Visit: Payer: 59 | Admitting: Internal Medicine

## 2022-07-04 ENCOUNTER — Ambulatory Visit: Payer: Self-pay | Admitting: *Deleted

## 2022-07-04 NOTE — Telephone Encounter (Signed)
Reason for Disposition  [1] SEVERE headache (e.g., excruciating) AND [2] "worst headache" of life  Answer Assessment - Initial Assessment Questions 1. LOCATION: "Where does it hurt?"      *No Answer* 2. ONSET: "When did the headache start?" (Minutes, hours or days)      1-1/2 months 3. PATTERN: "Does the pain come and go, or has it been constant since it started?"     Comes and goes. More frequency 4. SEVERITY: "How bad is the pain?" and "What does it keep you from doing?"  (e.g., Scale 1-10; mild, moderate, or severe)   - MILD (1-3): doesn't interfere with normal activities    - MODERATE (4-7): interferes with normal activities or awakens from sleep    - SEVERE (8-10): excruciating pain, unable to do any normal activities        11/10 5. RECURRENT SYMPTOM: "Have you ever had headaches before?" If Yes, ask: "When was the last time?" and "What happened that time?"      Yes, years 6. CAUSE: "What do you think is causing the headache?"     "Cluster migraines" 7. MIGRAINE: "Have you been diagnosed with migraine headaches?" If Yes, ask: "Is this headache similar?"      In past 8. HEAD INJURY: "Has there been any recent injury to the head?"      no 9. OTHER SYMPTOMS: "Do you have any other symptoms?" (fever, stiff neck, eye pain, sore throat, cold symptoms)     Visual changes, left shoulder painful, finger tips numb  Protocols used: Headache-A-AH

## 2022-07-04 NOTE — Telephone Encounter (Signed)
  Chief Complaint: Headache Symptoms: 11/10 headache. States H/O cluster headaches "Years ago." Reports been coming on for 1 1/2 months. Worse today, visual changes. Also reports left shoulder pain and fingers tingling, "Weak." Frequency: Today's headache worse with other symptoms Pertinent Negatives: Patient denies  Disposition: [x] ED /[] Urgent Care (no appt availability in office) / [] Appointment(In office/virtual)/ []  [facility]/ [] Home Care/ [] Refused Recommended Disposition /[] [facility]/ []  Follow-up with PCP Additional Notes: Advised ED. States will follow disposition. HAs driver.

## 2022-08-19 NOTE — Progress Notes (Unsigned)
Name: Kevin Moran   MRN: [id number]    DOB: 03-21-1966   Date:08/20/2022       Progress Note  Subjective  Chief Complaint  Chief Complaint  Patient presents with   Annual Exam    HPI  Patient presents for annual CPE.  IPSS Questionnaire (AUA-7): Over the past month.   1)  How often have you had a sensation of not emptying your bladder completely after you finish urinating?  0 - Not at all  2)  How often have you had to urinate again less than two hours after you finished urinating? 2 - Less than half the time  3)  How often have you found you stopped and started again several times when you urinated?  0 - Not at all  4) How difficult have you found it to postpone urination?  1 - Less than 1 time in 5  5) How often have you had a weak urinary stream?  0 - Not at all  6) How often have you had to push or strain to begin urination?  0 - Not at all  7) How many times did you most typically get up to urinate from the time you went to bed until the time you got up in the morning?  2 - 2 times  Total score:  0-7 mildly symptomatic   8-19 moderately symptomatic   20-35 severely symptomatic     Diet: not well rounded, cook at home. Switching to baked foods  Exercise: walking Last Dental Exam: Has a dentist - needs a tooth pulled  Last Eye Exam: no  Depression: phq 9 is negative    08/20/2022    8:48 AM 03/18/2022    1:14 PM 02/14/2022    9:00 AM  Depression screen PHQ 2/9  Decreased Interest 0 0 0  Down, Depressed, Hopeless 0 0 0  PHQ - 2 Score 0 0 0  Altered sleeping 0 0 0  Tired, decreased energy 0 0 0  Change in appetite 0 0 0  Feeling bad or failure about yourself  0 0 0  Trouble concentrating 0 0 0  Moving slowly or fidgety/restless 0 0 0  Suicidal thoughts 0 0 0  PHQ-9 Score 0 0 0  Difficult doing work/chores Not difficult at all Not difficult at all Not difficult at all    Hypertension:  BP Readings from Last 3 Encounters:  08/20/22 124/76  03/18/22 122/80   02/14/22 104/76    Obesity: Wt Readings from Last 3 Encounters:  08/20/22 156 lb 3.2 oz (70.9 kg)  03/18/22 153 lb 11.2 oz (69.7 kg)  02/14/22 157 lb 9.6 oz (71.5 kg)   BMI Readings from Last 3 Encounters:  08/20/22 20.33 kg/m  03/18/22 20.00 kg/m  02/14/22 20.51 kg/m     Lipids:  Lab Results  Component Value Date   CHOL 229 (H) 02/14/2022   Lab Results  Component Value Date   HDL 88 02/14/2022   Lab Results  Component Value Date   LDLCALC 127 (H) 02/14/2022   Lab Results  Component Value Date   TRIG 58 02/14/2022   Lab Results  Component Value Date   CHOLHDL 2.6 02/14/2022   No results found for: "LDLDIRECT" Glucose:  Glucose, Bld  Date Value Ref Range Status  02/14/2022 90 65 - 99 mg/dL Final    Comment:    .            Fasting reference interval .   10/08/2016  102 (H) 65 - 99 mg/dL Final  20/35/5974 163 (H) 70 - 99 mg/dL Final     Single STD testing and prevention (HIV/chl/gon/syphilis):  no concerns  Hep C Screening: 2023  Skin cancer: Discussed monitoring for atypical lesions Colorectal cancer: Due Prostate cancer:  yes Lab Results  Component Value Date   PSA 0.64 02/14/2022    Lung cancer:  Low Dose CT Chest recommended if Age 28-80 years, 30 pack-year currently smoking OR have quit w/in 15years. Patient  no a candidate for screening   AAA: The USPSTF recommends one-time screening with ultrasonography in men ages 28 to 75 years who have ever smoked. Patient   no, a candidate for screening   Vaccines:   HPV: n/a Tdap: 2023 Shingrix: Due Pneumonia: Due at age 41 Flu: Due COVID-19: original 3 vaccines   Advanced Care Planning: A voluntary discussion about advance care planning including the explanation and discussion of advance directives.  Discussed health care proxy and Living will, and the patient was unable to identify a health care proxy. Patient does not have a living will and power of attorney of health care   Patient Active  Problem List   Diagnosis Date Noted   Moderate mixed hyperlipidemia not requiring statin therapy 03/18/2022   Gastroesophageal reflux disease 03/18/2022   Left calcaneal fracture 07/14/2011   Closed right pilon fracture 07/14/2011    Past Surgical History:  Procedure Laterality Date   TOE SURGERY Left    big toe reattachment    Family History  Problem Relation Age of Onset   Aneurysm Mother    Cancer Father    COPD Brother     Social History   Socioeconomic History   Marital status: Single    Spouse name: Not on file   Number of children: Not on file   Years of education: Not on file   Highest education level: Not on file  Occupational History   Not on file  Tobacco Use   Smoking status: Light Smoker    Types: Cigarettes   Smokeless tobacco: Never  Vaping Use   Vaping Use: Never used  Substance and Sexual Activity   Alcohol use: Yes    Comment: occ   Drug use: No   Sexual activity: Yes  Other Topics Concern   Not on file  Social History Narrative   Not on file   Social Determinants of Health   Financial Resource Strain: Medium Risk (08/20/2022)   Overall Financial Resource Strain (CARDIA)    Difficulty of Paying Living Expenses: Somewhat hard  Food Insecurity: Food Insecurity Present (08/20/2022)   Hunger Vital Sign    Worried About Running Out of Food in the Last Year: Sometimes true    Ran Out of Food in the Last Year: Never true  Transportation Needs: No Transportation Needs (08/20/2022)   PRAPARE - Administrator, Civil Service (Medical): No    Lack of Transportation (Non-Medical): No  Physical Activity: Sufficiently Active (08/20/2022)   Exercise Vital Sign    Days of Exercise per Week: 5 days    Minutes of Exercise per Session: 30 min  Stress: No Stress Concern Present (08/20/2022)   [employer] of Occupational Health - Occupational Stress Questionnaire    Feeling of Stress : Only a little  Social Connections: Moderately  Integrated (08/20/2022)   Social Connection and Isolation Panel [NHANES]    Frequency of Communication with Friends and Family: More than three times a week  Frequency of Social Gatherings with Friends and Family: Once a week    Attends Religious Services: 1 to 4 times per year    Active Member of [employer] or Organizations: No    Attends Banker Meetings: Never    Marital Status: Living with partner  Intimate Partner Violence: Not At Risk (08/20/2022)   Humiliation, Afraid, Rape, and Kick questionnaire    Fear of Current or Ex-Partner: No    Emotionally Abused: No    Physically Abused: No    Sexually Abused: No     Current Outpatient Medications:    pantoprazole (PROTONIX) 40 MG tablet, Take 1 tablet (40 mg total) by mouth daily., Disp: 30 tablet, Rfl: 3  No Known Allergies   Review of Systems  All other systems reviewed and are negative.    Objective  Vitals:   08/20/22 0841  BP: 124/76  Pulse: 97  Resp: 18  Temp: 98.7 F (37.1 C)  SpO2: 99%  Weight: 156 lb 3.2 oz (70.9 kg)  Height: 6' 1.5" (1.867 m)    Body mass index is 20.33 kg/m.  Physical Exam Constitutional:      Appearance: Normal appearance.  HENT:     Head: Normocephalic and atraumatic.     Mouth/Throat:     Mouth: Mucous membranes are moist.     Pharynx: Oropharynx is clear.  Eyes:     Conjunctiva/sclera: Conjunctivae normal.  Cardiovascular:     Rate and Rhythm: Normal rate and regular rhythm.  Pulmonary:     Effort: Pulmonary effort is normal.     Breath sounds: Normal breath sounds.  Skin:    General: Skin is warm and dry.  Neurological:     General: No focal deficit present.     Mental Status: He is alert. Mental status is at baseline.  Psychiatric:        Mood and Affect: Mood normal.        Behavior: Behavior normal.     No results found for this or any previous visit (from the past 2160 hour(s)).   Fall Risk:    08/20/2022    8:45 AM 03/18/2022    1:14 PM  02/14/2022    9:00 AM  Fall Risk   Falls in the past year? 0 0 0  Number falls in past yr: 0 0 0  Injury with Fall? 0 0 0     Functional Status Survey: Is the patient deaf or have difficulty hearing?: No Does the patient have difficulty seeing, even when wearing glasses/contacts?: Yes Does the patient have difficulty concentrating, remembering, or making decisions?: No Does the patient have difficulty walking or climbing stairs?: No Does the patient have difficulty dressing or bathing?: No Does the patient have difficulty doing errands alone such as visiting a doctor's office or shopping?: No    Assessment & Plan  1. Annual physical exam/Screening for colon cancer: Due for colon cancer screening, everything else up to date. Referral placed.   - Ambulatory referral to Gastroenterology  2. Erectile dysfunction, unspecified erectile dysfunction type: Refills send, Good Rx card provided.   - tadalafil (CIALIS) 10 MG tablet; Take 1 tablet (10 mg total) by mouth every other day as needed for erectile dysfunction.  Dispense: 30 tablet; Refill: 0   -Prostate cancer screening and PSA options (with potential risks and benefits of testing vs not testing) were discussed along with recent recs/guidelines. -USPSTF grade A and B recommendations reviewed with patient; age-appropriate recommendations, preventive care, screening tests,  etc discussed and encouraged; healthy living encouraged; see AVS for patient education given to patient -Discussed importance of 150 minutes of physical activity weekly, eat two servings of fish weekly, eat one serving of tree nuts ( cashews, pistachios, pecans, almonds.Marland Kitchen.) every other day, eat 6 servings of fruit/vegetables daily and drink plenty of water and avoid sweet beverages.  -Reviewed Health Maintenance: yes

## 2022-08-20 ENCOUNTER — Encounter: Payer: Self-pay | Admitting: Internal Medicine

## 2022-08-20 ENCOUNTER — Ambulatory Visit (INDEPENDENT_AMBULATORY_CARE_PROVIDER_SITE_OTHER): Payer: 59 | Admitting: Internal Medicine

## 2022-08-20 VITALS — BP 124/76 | HR 97 | Temp 98.7°F | Resp 18 | Ht 73.5 in | Wt 156.2 lb

## 2022-08-20 DIAGNOSIS — Z1211 Encounter for screening for malignant neoplasm of colon: Secondary | ICD-10-CM | POA: Diagnosis not present

## 2022-08-20 DIAGNOSIS — Z0000 Encounter for general adult medical examination without abnormal findings: Secondary | ICD-10-CM | POA: Diagnosis not present

## 2022-08-20 DIAGNOSIS — N529 Male erectile dysfunction, unspecified: Secondary | ICD-10-CM

## 2022-08-20 MED ORDER — TADALAFIL 10 MG PO TABS: 10.0000 mg | ORAL_TABLET | ORAL | 0 refills | Status: AC | PRN

## 2022-08-20 NOTE — Patient Instructions (Signed)
It was great seeing you today!  Plan discussed at today's visit: -Referral to GI placed for colonoscopy  -Consider Shingles vaccine, discuss with pharmacist   Follow up in:  Take care and let us know if you have any questions or concerns prior to your next visit.  Dr. Caralee Ates

## 2022-08-23 ENCOUNTER — Encounter: Payer: Self-pay | Admitting: Gastroenterology

## 2022-08-23 ENCOUNTER — Other Ambulatory Visit: Payer: Self-pay | Admitting: *Deleted

## 2022-08-23 ENCOUNTER — Telehealth: Payer: Self-pay | Admitting: *Deleted

## 2022-08-23 DIAGNOSIS — Z1211 Encounter for screening for malignant neoplasm of colon: Secondary | ICD-10-CM

## 2022-08-23 MED ORDER — NA SULFATE-K SULFATE-MG SULF 17.5-3.13-1.6 GM/177ML PO SOLN: 1.0000 | Freq: Once | ORAL | 0 refills | Status: AC

## 2022-08-23 NOTE — Telephone Encounter (Signed)
Gastroenterology Pre-Procedure Review  Request Date: 08/28/2022 Requesting Physician: Dr. Tobi Bastos  PATIENT REVIEW QUESTIONS: The patient responded to the following health history questions as indicated:    1. Are you having any GI issues? no 2. Do you have a personal history of Polyps? no 3. Do you have a family history of Colon Cancer or Polyps? no 4. Diabetes Mellitus? no 5. Joint replacements in the past 12 months?no 6. Major health problems in the past 3 months?no 7. Any artificial heart valves, MVP, or defibrillator?no    MEDICATIONS & ALLERGIES:    Patient reports the following regarding taking any anticoagulation/antiplatelet therapy:   Plavix, Coumadin, Eliquis, Xarelto, Lovenox, Pradaxa, Brilinta, or Effient? no Aspirin? no  Patient confirms/reports the following medications:  Current Outpatient Medications  Medication Sig Dispense Refill   pantoprazole (PROTONIX) 40 MG tablet Take 1 tablet (40 mg total) by mouth daily. 30 tablet 3   tadalafil (CIALIS) 10 MG tablet Take 1 tablet (10 mg total) by mouth every other day as needed for erectile dysfunction. 30 tablet 0   No current facility-administered medications for this visit.    Patient confirms/reports the following allergies:  No Known Allergies  No orders of the defined types were placed in this encounter.   AUTHORIZATION INFORMATION Primary Insurance: 1D#: Group #:  Secondary Insurance: 1D#: Group #:  SCHEDULE INFORMATION: Date: 08/28/2022 Time: Location: [facility]

## 2022-08-28 ENCOUNTER — Ambulatory Visit: Admission: RE | Admit: 2022-08-28 | Payer: 59 | Source: Home / Self Care | Admitting: Gastroenterology

## 2022-08-28 ENCOUNTER — Encounter: Admission: RE | Payer: Self-pay | Source: Home / Self Care

## 2022-08-28 SURGERY — COLONOSCOPY WITH PROPOFOL
Anesthesia: General

## 2022-09-24 IMAGING — US US ABDOMEN COMPLETE
1 series · 14 of 25 positions shown · non-contrast
Comparison: None Available.

CLINICAL DATA: Acid reflux.  Epigastric pain for 6 months.

EXAM:
ABDOMEN ULTRASOUND COMPLETE

[Series 1: us abdomen complete · 14 of 119 slices shown]
[im 1/119]
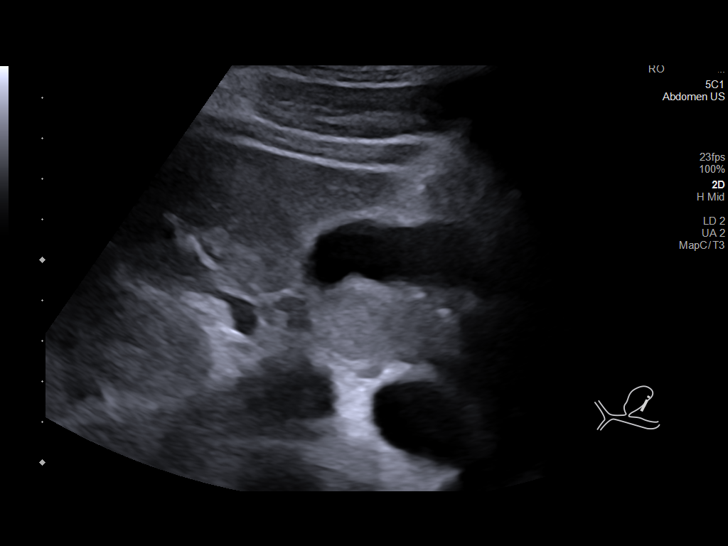
[im 10/119]
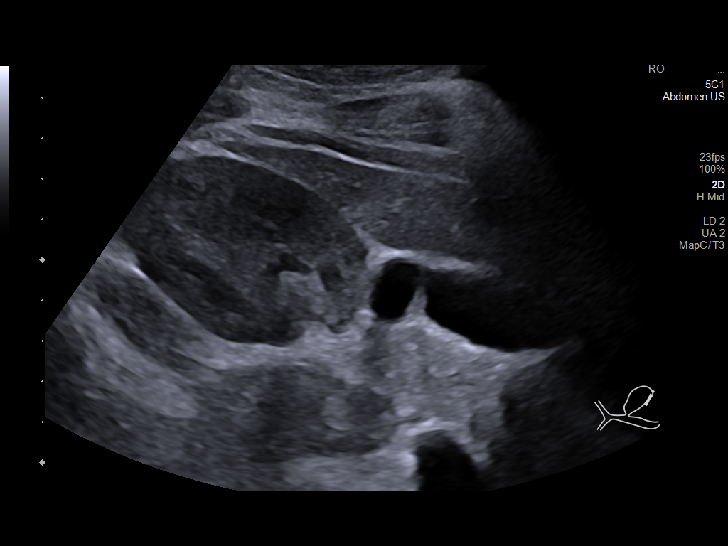
[im 20/119]
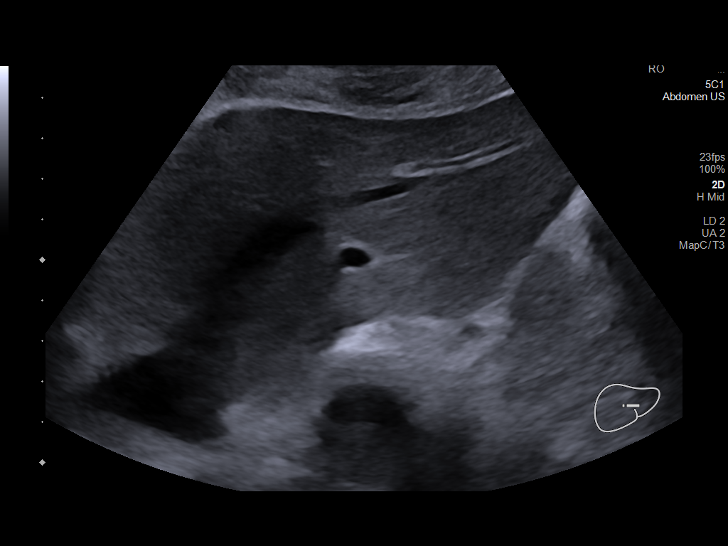
[im 30/119]
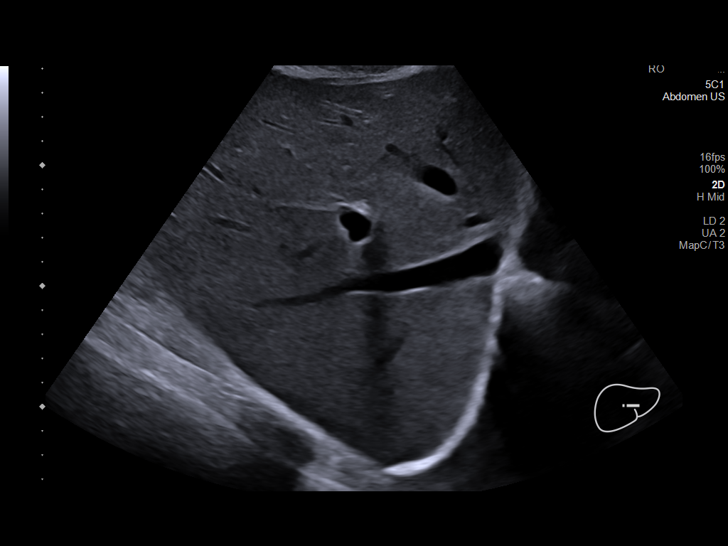
[im 40/119]
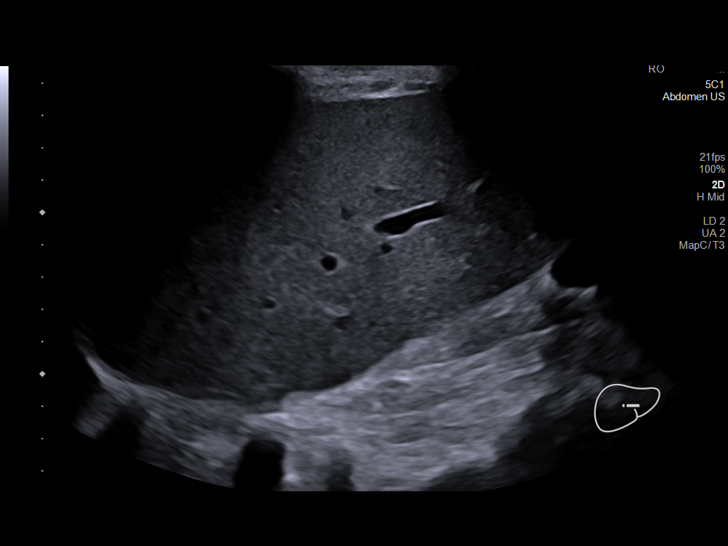
[im 45/119]
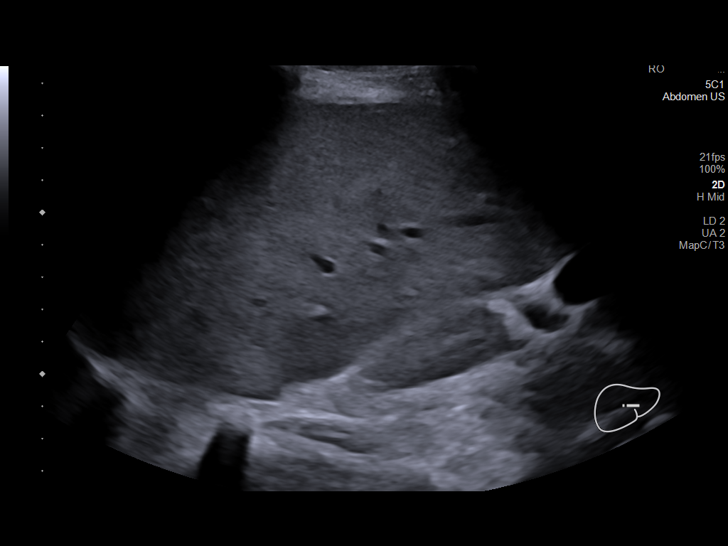
[im 55/119]
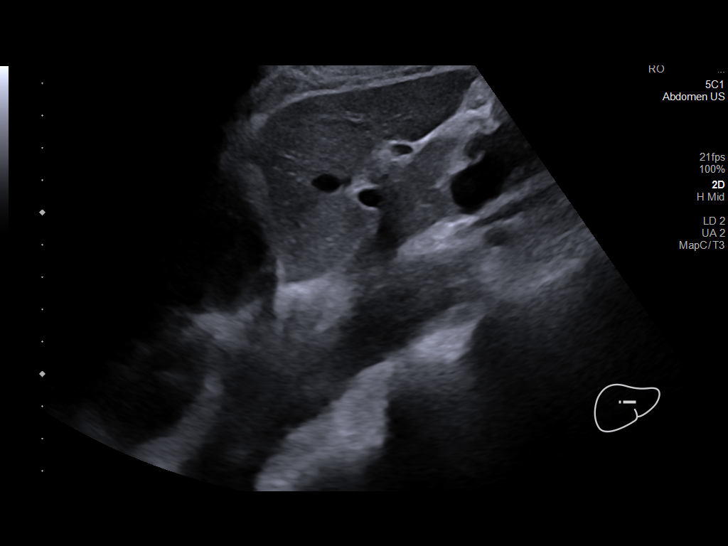
[im 64/119]
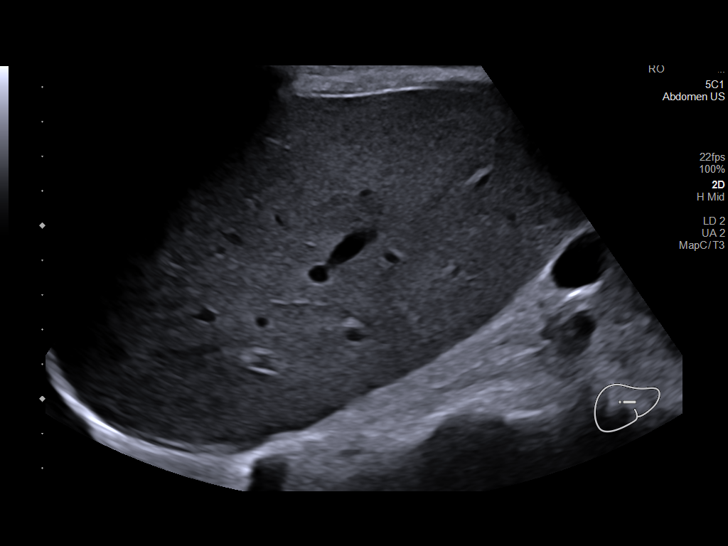
[im 74/119]
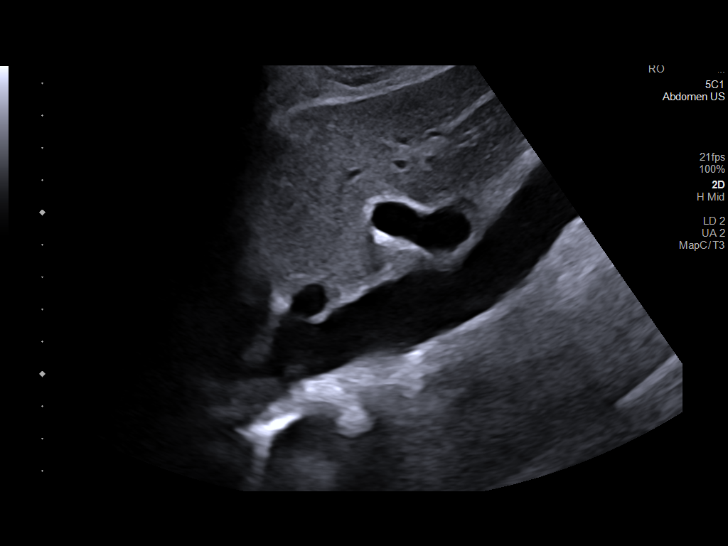
[im 79/119]
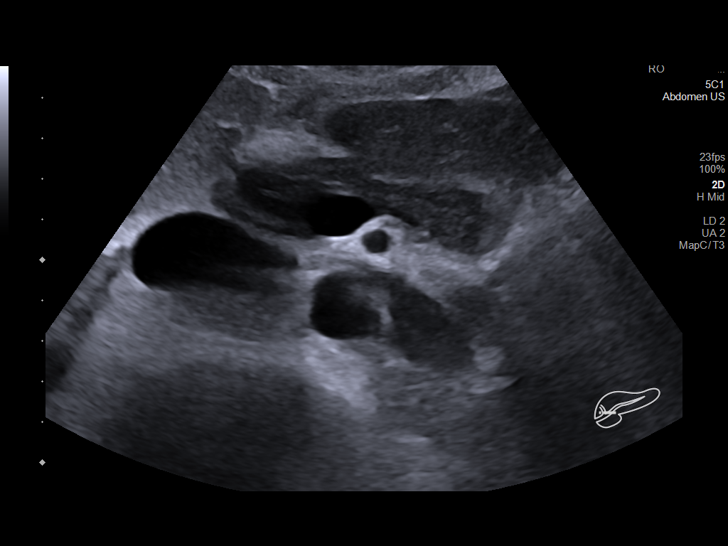
[im 89/119]
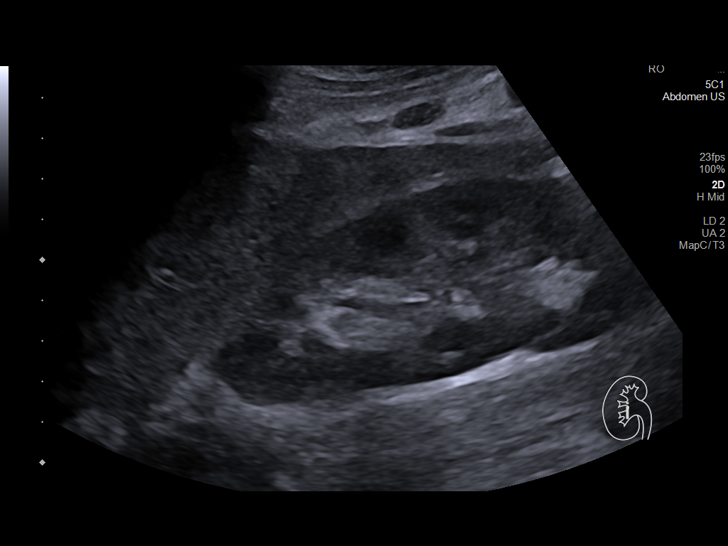
[im 99/119]
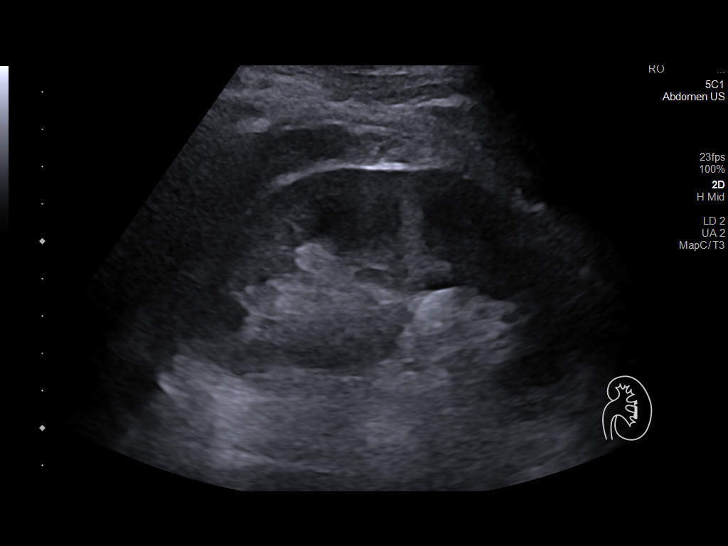
[im 109/119]
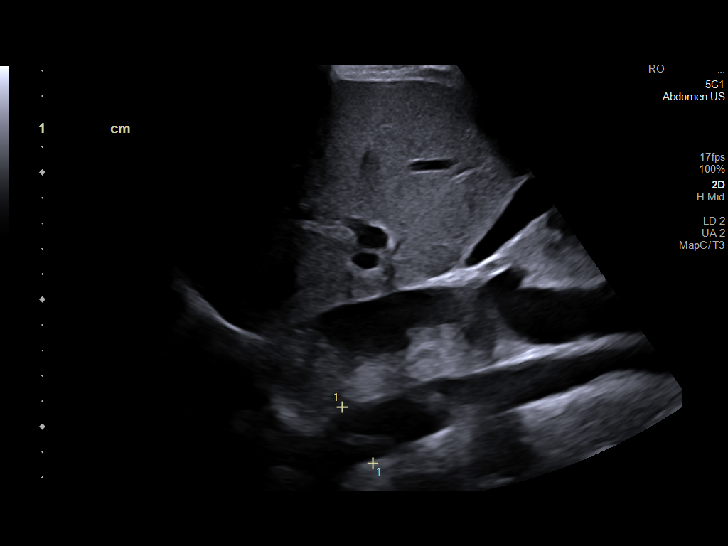
[im 119/119]
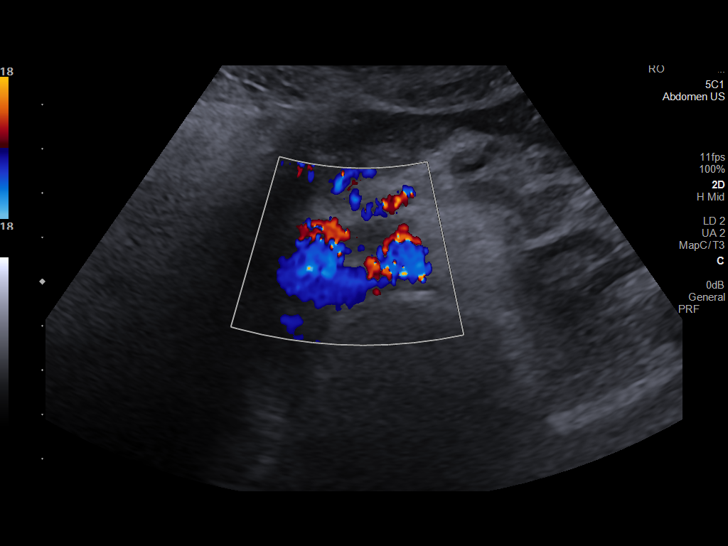

[14 of 25 positions shown; findings below may reference images not displayed]

FINDINGS: Gallbladder: No gallstones or wall thickening visualized. No
sonographic Murphy sign noted by sonographer.

Common bile duct: Diameter: 2.8 mm

Liver: Mild increased echogenicity in the liver. No focal mass.
Portal vein is patent on color Doppler imaging with normal direction
of blood flow towards the liver.

IVC: No abnormality visualized.

Pancreas: Visualized portion unremarkable.

Spleen: Size and appearance within normal limits.

Right Kidney: Length: 12.8 cm. Echogenicity within normal limits. No
mass or hydronephrosis visualized.

Left Kidney: Length: 10.9 cm. Echogenicity within normal limits. No
mass or hydronephrosis visualized.

Abdominal aorta: No aneurysm visualized.

Other findings: None.
IMPRESSION: 1. Probable mild hepatic steatosis. No other abnormalities are
identified to explain the patient's symptoms.

1.

## 2023-05-07 ENCOUNTER — Telehealth: Payer: Self-pay

## 2023-05-07 NOTE — Telephone Encounter (Signed)
Attempted wellness call to [facility] client no answer, left voicemail requesting return call.   Next appt at [facility] 10/22/23  Francee Nodal RN  Clara Gunn/[facility]

## 2023-08-21 NOTE — Progress Notes (Deleted)
Name: Kevin Moran   MRN: [id number]    DOB: 10/12/1965   Date:08/21/2023       Progress Note  Subjective  Chief Complaint  No chief complaint on file.   HPI  Patient presents for annual CPE .    Diet: *** Exercise: *** Last Dental Exam: **** Last Eye Exam: ***  Depression: phq 9 is {gen pos GMW:102725}    08/20/2022    8:48 AM 03/18/2022    1:14 PM 02/14/2022    9:00 AM  Depression screen PHQ 2/9  Decreased Interest 0 0 0  Down, Depressed, Hopeless 0 0 0  PHQ - 2 Score 0 0 0  Altered sleeping 0 0 0  Tired, decreased energy 0 0 0  Change in appetite 0 0 0  Feeling bad or failure about yourself  0 0 0  Trouble concentrating 0 0 0  Moving slowly or fidgety/restless 0 0 0  Suicidal thoughts 0 0 0  PHQ-9 Score 0 0 0  Difficult doing work/chores Not difficult at all Not difficult at all Not difficult at all    Hypertension:  BP Readings from Last 3 Encounters:  08/20/22 124/76  03/18/22 122/80  02/14/22 104/76    Obesity: Wt Readings from Last 3 Encounters:  08/20/22 156 lb 3.2 oz (70.9 kg)  03/18/22 153 lb 11.2 oz (69.7 kg)  02/14/22 157 lb 9.6 oz (71.5 kg)   BMI Readings from Last 3 Encounters:  08/20/22 20.33 kg/m  03/18/22 20.00 kg/m  02/14/22 20.51 kg/m      ***  Single STD testing and prevention (HIV/chl/gon/syphilis):  {yes/no/default n/a:21102::"not applicable"} Sexual history:  Hep C Screening: completed Skin cancer: Discussed monitoring for atypical lesions Colorectal cancer: *** Prostate cancer:  {yes/no/default n/a:21102::"not applicable"} Lab Results  Component Value Date   PSA 0.64 02/14/2022     Lung cancer:  Low Dose CT Chest recommended if Age 68-80 years, 30 pack-year currently smoking OR have quit w/in 15years. Patient  {ACTION; IS/IS DGU:44034742} a candidate for screening   AAA: The USPSTF recommends one-time screening with ultrasonography in men ages 14 to 75 years who have ever smoked. Patient   {ACTION; IS/IS  VZD:63875643} a candidate for screening  ECG:  ***  Vaccines:  RSV: encouraged to get at the pharmacy HPV: not applicable Tdap: completed Shingrix: encouraged to get at the pharmacy Pneumonia: {vaccine options:31805} Flu: {vaccine options:31805} COVID-19: {vaccine options:31805}  Advanced Care Planning: A voluntary discussion about advance care planning including the explanation and discussion of advance directives.  Discussed health care proxy and Living will, and the patient was able to identify a health care proxy as ***.  Patient {DOES_DOES PIR:51884} have a living will and power of attorney of health care   Patient Active Problem List   Diagnosis Date Noted   Moderate mixed hyperlipidemia not requiring statin therapy 03/18/2022   Gastroesophageal reflux disease 03/18/2022   Left calcaneal fracture 07/14/2011   Closed right pilon fracture 07/14/2011    Past Surgical History:  Procedure Laterality Date   TOE SURGERY Left    big toe reattachment    Family History  Problem Relation Age of Onset   Aneurysm Mother    Cancer Father    COPD Brother     Social History   Socioeconomic History   Marital status: Single    Spouse name: Not on file   Number of children: Not on file   Years of education: Not on file   Highest education level: Not  on file  Occupational History   Not on file  Tobacco Use   Smoking status: Light Smoker    Types: Cigarettes   Smokeless tobacco: Never  Vaping Use   Vaping status: Never Used  Substance and Sexual Activity   Alcohol use: Yes    Comment: occ   Drug use: No   Sexual activity: Yes  Other Topics Concern   Not on file  Social History Narrative   Not on file   Social Drivers of Health   Financial Resource Strain: Medium Risk (08/20/2022)   Overall Financial Resource Strain (CARDIA)    Difficulty of Paying Living Expenses: Somewhat hard  Food Insecurity: Food Insecurity Present (08/20/2022)   Hunger Vital Sign    Worried  About Running Out of Food in the Last Year: Sometimes true    Ran Out of Food in the Last Year: Never true  Transportation Needs: No Transportation Needs (08/20/2022)   PRAPARE - Administrator, Civil Service (Medical): No    Lack of Transportation (Non-Medical): No  Physical Activity: Sufficiently Active (08/20/2022)   Exercise Vital Sign    Days of Exercise per Week: 5 days    Minutes of Exercise per Session: 30 min  Stress: No Stress Concern Present (08/20/2022)   [employer] of Occupational Health - Occupational Stress Questionnaire    Feeling of Stress : Only a little  Social Connections: Moderately Integrated (08/20/2022)   Social Connection and Isolation Panel [NHANES]    Frequency of Communication with Friends and Family: More than three times a week    Frequency of Social Gatherings with Friends and Family: Once a week    Attends Religious Services: 1 to 4 times per year    Active Member of [employer] or Organizations: No    Attends Banker Meetings: Never    Marital Status: Living with partner  Intimate Partner Violence: Not At Risk (08/20/2022)   Humiliation, Afraid, Rape, and Kick questionnaire    Fear of Current or Ex-Partner: No    Emotionally Abused: No    Physically Abused: No    Sexually Abused: No     Current Outpatient Medications:    pantoprazole (PROTONIX) 40 MG tablet, Take 1 tablet (40 mg total) by mouth daily., Disp: 30 tablet, Rfl: 3   tadalafil (CIALIS) 10 MG tablet, Take 1 tablet (10 mg total) by mouth every other day as needed for erectile dysfunction., Disp: 30 tablet, Rfl: 0  No Known Allergies   ROS  ***   Objective  There were no vitals filed for this visit.  There is no height or weight on file to calculate BMI.  Physical Exam ***  {Show previous labs (optional):23779}  Assessment & Plan There are no diagnoses linked to this encounter.   -Prostate cancer screening and PSA options (with potential risks  and benefits of testing vs not testing) were discussed along with recent recs/guidelines. -USPSTF grade A and B recommendations reviewed with patient; age-appropriate recommendations, preventive care, screening tests, etc discussed and encouraged; healthy living encouraged; see AVS for patient education given to patient -Discussed importance of 150 minutes of physical activity weekly, eat two servings of fish weekly, eat one serving of tree nuts ( cashews, pistachios, pecans, almonds.Marland Kitchen) every other day, eat 6 servings of fruit/vegetables daily and drink plenty of water and avoid sweet beverages.  -Reviewed Health Maintenance: {yes/no/default n/a:21102::"not applicable"}

## 2023-08-22 ENCOUNTER — Encounter: Payer: 59 | Admitting: Internal Medicine

## 2024-08-11 ENCOUNTER — Encounter: Payer: Self-pay | Admitting: Emergency Medicine

## 2024-08-11 ENCOUNTER — Other Ambulatory Visit: Payer: Self-pay

## 2024-08-11 ENCOUNTER — Emergency Department: Payer: Self-pay

## 2024-08-11 ENCOUNTER — Emergency Department
Admission: EM | Admit: 2024-08-11 | Discharge: 2024-08-11 | Disposition: A | Discharge status: Home / Self Care | Payer: Self-pay | Attending: Emergency Medicine | Admitting: Emergency Medicine

## 2024-08-11 DIAGNOSIS — M79672 Pain in left foot: Secondary | ICD-10-CM | POA: Insufficient documentation

## 2024-08-11 MED ORDER — CEPHALEXIN 500 MG PO CAPS: 500.0000 mg | ORAL_CAPSULE | Freq: Four times a day (QID) | ORAL | 0 refills | Status: AC

## 2024-08-11 NOTE — ED Provider Notes (Signed)
 [facility] Provider Note    None    (approximate)   History   Foot Pain   HPI  Kevin Moran is a 58 y.o. male who presents today for evaluation of pain between his great toe and his second toe on the left foot for the last couple of days.  He reports that he thinks that something bit him.  He has been putting Neosporin on the area.  No numbness or tingling.  No swelling.  No fevers or chills.  No open wounds.  Patient Active Problem List   Diagnosis Date Noted   Moderate mixed hyperlipidemia not requiring statin therapy 03/18/2022   Gastroesophageal reflux disease 03/18/2022   Left calcaneal fracture 07/14/2011   Closed right pilon fracture 07/14/2011          Physical Exam   Triage Vital Signs: ED Triage Vitals  Encounter Vitals Group     BP 08/11/24 0928 118/82     Girls Systolic BP Percentile --      Girls Diastolic BP Percentile --      Boys Systolic BP Percentile --      Boys Diastolic BP Percentile --      Pulse Rate 08/11/24 0928 76     Resp 08/11/24 0928 16     Temp 08/11/24 0928 97.6 F (36.4 C)     Temp Source 08/11/24 0928 Oral     SpO2 08/11/24 0928 100 %     Weight 08/11/24 0929 155 lb (70.3 kg)     Height 08/11/24 0929 6' 1.5 (1.867 m)     Head Circumference --      Peak Flow --      Pain Score 08/11/24 0928 9     Pain Loc --      Pain Education --      Exclude from Growth Chart --     Most recent vital signs: Vitals:   08/11/24 0928  BP: 118/82  Pulse: 76  Resp: 16  Temp: 97.6 F (36.4 C)  SpO2: 100%    Physical Exam Vitals and nursing note reviewed.  Constitutional:      General: Awake and alert. No acute distress.    Appearance: Normal appearance. The patient is normal weight.  HENT:     Head: Normocephalic and atraumatic.     Mouth: Mucous membranes are moist.  Eyes:     General: PERRL. Normal EOMs        Right eye: No discharge.        Left eye: No discharge.     Conjunctiva/sclera:  Conjunctivae normal.  Cardiovascular:     Rate and Rhythm: Normal rate.     Pulses: Normal pulses.  Pulmonary:     Effort: Pulmonary effort is normal.      Breath sounds: Normal breath sounds.  Abdominal:     Abdomen is soft. There is no abdominal tenderness. No rebound or guarding. No distention. Musculoskeletal:        General: No swelling. Normal range of motion.     Cervical back: Normal range of motion and neck supple.  Left foot: Small superficial ulceration between great toe and second toe where toes rub together.  There is no surrounding erythema.  No drainage.  No swelling.  Normal pedal pulses.  No distal toe findings. Skin:    General: Skin is warm and dry.     Capillary Refill: Capillary refill takes less than 2 seconds.     Findings:  No rash.  Neurological:     Mental Status: The patient is awake and alert.      ED Results / Procedures / Treatments   Labs (all labs ordered are listed, but only abnormal results are displayed) Labs Reviewed - No data to display   EKG     RADIOLOGY I independently reviewed and interpreted imaging and agree with radiologists findings.     PROCEDURES:  Critical Care performed:   Procedures   MEDICATIONS ORDERED IN ED: Medications - No data to display   IMPRESSION / MDM / ASSESSMENT AND PLAN / ED COURSE  I reviewed the triage vital signs and the nursing notes.   Differential diagnosis includes, but is not limited to, cellulitis, ulceration, contusion, abrasion.  Patient is awake and alert, hemodynamically stable and afebrile.  Nontoxic in appearance.  He has a small area of skin breakdown where his toes are rubbing together.  There is no surrounding erythema, no drainage, no swelling.  No proximal foot pain or tenderness.  There is no distal toe findings.  X-ray obtained reveals possible foreign body in the distal toe, though there is no tenderness or abnormal findings here.  No open wounds here.  Recommended  outpatient follow-up with podiatry.  In the meantime we will start Keflex for possible early cellulitis.  He was also advised to put a thin piece of gauze between his toes in order to help prevent them from rubbing against each other and causing further skin breakdown.  We also discussed return precautions.  Patient understands agrees with plan.  Discharged in stable condition.   Patient's presentation is most consistent with acute complicated illness / injury requiring diagnostic workup.     FINAL CLINICAL IMPRESSION(S) / ED DIAGNOSES   Final diagnoses:  Foot pain, left     Rx / DC Orders   ED Discharge Orders          Ordered    cephALEXin (KEFLEX) 500 MG capsule  4 times daily        08/11/24 1012             Note:  This document was prepared using Dragon voice recognition software and may include unintentional dictation errors.   Rebecca Motta E, PA-C 08/11/24 1132    Viviann Pastor, MD 08/12/24 1320

## 2024-08-11 NOTE — Discharge Instructions (Signed)
 Please follow-up with the podiatrist.  Take the antibiotics as prescribed.  Return for any new, worsening, or changing symptoms or other concerns.  It was a pleasure caring for you today.

## 2024-08-11 NOTE — ED Triage Notes (Signed)
 Patient arrives ambulatory by POV concerned for spider bite on left foot. Patient reports pain, swelling and blistering to left foot.
